# Patient Record
Sex: Female | Born: 2000 | Race: White | Hispanic: No | Marital: Single | Smoking: Current some day smoker
Health system: Southern US, Community
[De-identification: ages and names within clinical notes are randomized; demographics above are authoritative.]

## PROBLEM LIST (undated history)

## (undated) DIAGNOSIS — F4310 Post-traumatic stress disorder, unspecified: Secondary | ICD-10-CM

---

## 2021-10-03 ENCOUNTER — Other Ambulatory Visit: Payer: Self-pay

## 2021-10-03 ENCOUNTER — Emergency Department (HOSPITAL_COMMUNITY): Payer: No Typology Code available for payment source

## 2021-10-03 ENCOUNTER — Emergency Department (HOSPITAL_COMMUNITY)
Admission: EM | Admit: 2021-10-03 | Discharge: 2021-10-03 | Disposition: A | Discharge status: Home / Self Care | Payer: No Typology Code available for payment source | Attending: Emergency Medicine | Admitting: Emergency Medicine

## 2021-10-03 ENCOUNTER — Encounter (HOSPITAL_COMMUNITY): Payer: Self-pay

## 2021-10-03 DIAGNOSIS — R55 Syncope and collapse: Secondary | ICD-10-CM | POA: Diagnosis not present

## 2021-10-03 DIAGNOSIS — S0993XA Unspecified injury of face, initial encounter: Secondary | ICD-10-CM | POA: Diagnosis present

## 2021-10-03 DIAGNOSIS — N9489 Other specified conditions associated with female genital organs and menstrual cycle: Secondary | ICD-10-CM | POA: Insufficient documentation

## 2021-10-03 DIAGNOSIS — W19XXXA Unspecified fall, initial encounter: Secondary | ICD-10-CM | POA: Insufficient documentation

## 2021-10-03 DIAGNOSIS — S0081XA Abrasion of other part of head, initial encounter: Secondary | ICD-10-CM | POA: Diagnosis not present

## 2021-10-03 LAB — I-STAT BETA HCG BLOOD, ED (MC, WL, AP ONLY): I-stat hCG, quantitative: <5 m[IU]/mL (ref <5)

## 2021-10-03 LAB — CBG MONITORING, ED: Glucose-Capillary: 73 mg/dL (ref 70–99)

## 2021-10-03 LAB — CBC
HCT: 40 % (ref 36.0–46.0)
Hemoglobin: 12.8 g/dL (ref 12.0–15.0)
MCH: 27.4 pg (ref 26.0–34.0)
MCHC: 32 g/dL (ref 30.0–36.0)
MCV: 85.7 fL (ref 80.0–100.0)
Platelets: 338 10*3/uL (ref 150–400)
RBC: 4.67 MIL/uL (ref 3.87–5.11)
RDW: 13 % (ref 11.5–15.5)
WBC: 9 10*3/uL (ref 4.0–10.5)
nRBC: 0 % (ref 0.0–0.2)

## 2021-10-03 LAB — BASIC METABOLIC PANEL
Anion gap: 8 (ref 5–15)
BUN: 13 mg/dL (ref 6–20)
CO2: 25 mmol/L (ref 22–32)
Calcium: 9.6 mg/dL (ref 8.9–10.3)
Chloride: 107 mmol/L (ref 98–111)
Creatinine, Ser: 0.85 mg/dL (ref 0.44–1.00)
GFR, Estimated: >60 mL/min (ref >60)
Glucose, Bld: 90 mg/dL (ref 70–99)
Potassium: 3.9 mmol/L (ref 3.5–5.1)
Sodium: 140 mmol/L (ref 135–145)

## 2021-10-03 LAB — URINALYSIS, ROUTINE W REFLEX MICROSCOPIC
Bilirubin Urine: NEGATIVE
Glucose, UA: NEGATIVE mg/dL
Hgb urine dipstick: NEGATIVE
Ketones, ur: 5 mg/dL — AB
Nitrite: NEGATIVE
Protein, ur: NEGATIVE mg/dL
Specific Gravity, Urine: 1.019 (ref 1.005–1.030)
pH: 8 (ref 5.0–8.0)

## 2021-10-03 MED ORDER — ONDANSETRON 4 MG PO TBDP
4.0000 mg | ORAL_TABLET | Freq: Once | ORAL | Status: AC
  Administered 2021-10-03: 4 mg via ORAL
  Filled 2021-10-03: qty 1

## 2021-10-03 MED ORDER — FLUORESCEIN SODIUM 1 MG OP STRP
1.0000 | ORAL_STRIP | Freq: Once | OPHTHALMIC | Status: AC
  Administered 2021-10-03: 1 via OPHTHALMIC
  Filled 2021-10-03: qty 1

## 2021-10-03 MED ORDER — TETRACAINE HCL 0.5 % OP SOLN
1.0000 [drp] | Freq: Once | OPHTHALMIC | Status: AC
  Administered 2021-10-03: 1 [drp] via OPHTHALMIC
  Filled 2021-10-03: qty 4

## 2021-10-03 NOTE — ED Provider Notes (Signed)
?[facility]-EMERGENCY DEPT ?Provider Note ? ? ?CSN: [id number] ?Arrival date & time: 10/03/21  1333 ? ?  ? ?History ? ?Chief Complaint  ?Patient presents with  ? Fall  ? Near Syncope  ? ? ?Victoria Weaver is a 21 y.o. female.  Presents to ER due to concern for fall, near syncope.  Patient states that while she was at work she felt off balance and lightheaded and fell forward and hit the right side of her cheek, eye region.  She does not have any pain in her eye and does not have any blurriness in the eye but has noted a slight redness to the eye.  She does not have any chest pain or difficulty in breathing.  No neck pain or neck stiffness.  She denies any major medical problems.  No family history of sudden cardiac death. ? ?HPI ? ?  ? ?Home Medications ?Prior to Admission medications   ?Not on File  ?   ? ?Allergies    ?Patient has no allergy information on record.   ? ?Review of Systems   ?Review of Systems  ?Constitutional:  Negative for chills and fever.  ?HENT:  Negative for ear pain and sore throat.   ?Eyes:  Negative for pain and visual disturbance.  ?Respiratory:  Negative for cough and shortness of breath.   ?Cardiovascular:  Negative for chest pain and palpitations.  ?Gastrointestinal:  Negative for abdominal pain and vomiting.  ?Genitourinary:  Negative for dysuria and hematuria.  ?Musculoskeletal:  Negative for arthralgias and back pain.  ?Skin:  Negative for color change and rash.  ?Neurological:  Positive for light-headedness. Negative for seizures and syncope.  ?All other systems reviewed and are negative. ? ?Physical Exam ?Updated Vital Signs ?BP 127/85   Pulse 72   Temp (!) 97.3 ?F (36.3 ?C) (Oral)   Resp 19   LMP 09/19/2021 (Approximate)   SpO2 99%  ?Physical Exam ?Vitals and nursing note reviewed.  ?Constitutional:   ?   General: She is not in acute distress. ?   Appearance: She is well-developed.  ?HENT:  ?   Head: Normocephalic.  ?   Comments: Superficial abrasion below  right eye ?Eyes:  ?   Comments: There is slight redness in the conjunctiva of the right eye, there is no foreign body, there is no corneal abrasion noted on fluorescein stain, normal EOM, pupil is equal round and reactive to light  ?Cardiovascular:  ?   Rate and Rhythm: Normal rate and regular rhythm.  ?   Heart sounds: No murmur heard. ?Pulmonary:  ?   Effort: Pulmonary effort is normal. No respiratory distress.  ?   Breath sounds: Normal breath sounds.  ?Abdominal:  ?   Palpations: Abdomen is soft.  ?   Tenderness: There is no abdominal tenderness.  ?Musculoskeletal:     ?   General: No swelling.  ?   Cervical back: Neck supple. No rigidity or tenderness.  ?   Comments: Back: no C, T, L spine TTP, no step off or deformity ?RUE: no TTP throughout, no deformity, normal joint ROM, radial pulse intact, distal sensation and motor intact ?LUE: no TTP throughout, no deformity, normal joint ROM, radial pulse intact, distal sensation and motor intact ?RLE:  no TTP throughout, no deformity, normal joint ROM, distal pulse, sensation and motor intact ?LLE: no TTP throughout, no deformity, normal joint ROM, distal pulse, sensation and motor intact  ?Skin: ?   General: Skin is warm and dry.  ?  Capillary Refill: Capillary refill takes less than 2 seconds.  ?Neurological:  ?   Mental Status: She is alert.  ?Psychiatric:     ?   Mood and Affect: Mood normal.  ? ? ?ED Results / Procedures / Treatments   ?Labs ?(all labs ordered are listed, but only abnormal results are displayed) ?Labs Reviewed  ?URINALYSIS, ROUTINE W REFLEX MICROSCOPIC - Abnormal; Notable for the following components:  ?    Result Value  ? APPearance HAZY (*)   ? Ketones, ur 5 (*)   ? Leukocytes,Ua SMALL (*)   ? Bacteria, UA RARE (*)   ? All other components within normal limits  ?BASIC METABOLIC PANEL  ?CBC  ?CBG MONITORING, ED  ?I-STAT BETA HCG BLOOD, ED ([facility], [facility], [facility] ONLY)  ? ? ?EKG ?EKG Interpretation ? ?Date/Time:  Tuesday October 03 2021 13:44:40  EDT ?Ventricular Rate:  76 ?PR Interval:  147 ?QRS Duration: 82 ?QT Interval:  389 ?QTC Calculation: 438 ?R Axis:   76 ?Text Interpretation: Sinus rhythm ST elev, probable normal early repol pattern Baseline wander in lead(s) I Confirmed by Marianna Fussykstra, Chloey Ricard ([id number]) on 10/03/2021 3:26:33 PM ? ?Radiology ?CT HEAD WO CONTRAST (5MM) ? ?Result Date: 10/03/2021 ?CLINICAL DATA:  Fall, head trauma EXAM: CT HEAD WITHOUT CONTRAST TECHNIQUE: Contiguous axial images were obtained from the base of the skull through the vertex without intravenous contrast. RADIATION DOSE REDUCTION: This exam was performed according to the departmental dose-optimization program which includes automated exposure control, adjustment of the mA and/or kV according to patient size and/or use of iterative reconstruction technique. COMPARISON:  None. FINDINGS: Brain: No acute intracranial hemorrhage, mass effect, or herniation. No extra-axial fluid collections. No evidence of acute territorial infarct. No hydrocephalus. Vascular: No hyperdense vessel or unexpected calcification. Skull: Normal. Negative for fracture or focal lesion. Sinuses/Orbits: No acute finding. Other: None. IMPRESSION: No acute intracranial process identified. Electronically Signed   By: Jannifer Hickelaney  Williams M.D.   On: 10/03/2021 14:35  ? ?CT Maxillofacial Wo Contrast ? ?Result Date: 10/03/2021 ?CLINICAL DATA:  Facial trauma EXAM: CT MAXILLOFACIAL WITHOUT CONTRAST TECHNIQUE: Multidetector CT imaging of the maxillofacial structures was performed. Multiplanar CT image reconstructions were also generated. RADIATION DOSE REDUCTION: This exam was performed according to the departmental dose-optimization program which includes automated exposure control, adjustment of the mA and/or kV according to patient size and/or use of iterative reconstruction technique. COMPARISON:  None. FINDINGS: Osseous: No fracture or mandibular dislocation. No destructive process. Orbits: Negative. No traumatic or  inflammatory finding. Sinuses: No air-fluid levels. Soft tissues: No significant soft tissue swelling appreciated. Limited intracranial: No acute process identified. IMPRESSION: No acute fracture identified. Electronically Signed   By: Jannifer Hickelaney  Williams M.D.   On: 10/03/2021 14:56   ? ?Procedures ?Procedures  ? ? ?Medications Ordered in ED ?Medications  ?ondansetron (ZOFRAN-ODT) disintegrating tablet 4 mg (4 mg Oral Given 10/03/21 1359)  ?fluorescein ophthalmic strip 1 strip (1 strip Both Eyes Given 10/03/21 1606)  ?tetracaine (PONTOCAINE) 0.5 % ophthalmic solution 1 drop (1 drop Both Eyes Given 10/03/21 1606)  ? ? ?ED Course/ Medical Decision Making/ A&P ?  ?                        ?Medical Decision Making ?Amount and/or Complexity of Data Reviewed ?Labs: ordered. ? ?Risk ?Prescription drug management. ? ? ?21 year old presenting to ER after fall at work.  Possible lightheadedness/near syncope associated with this.  CT head was negative, CT max face was negative.  I independently reviewed CT images.  Due to possible near syncope, basic lab work was checked.  No electrolyte derangement and no anemia.  Her eye appeared normal except for a very slight amount of redness in her conjunctiva.  Check fluorescein stain which was unremarkable.  She has no ongoing symptoms.  Discharged home.  Advised follow-up with PCP. ? ? ? ?After the discussed management above, the patient was determined to be safe for discharge.  The patient was in agreement with this plan and all questions regarding their care were answered.  ED return precautions were discussed and the patient will return to the ED with any significant worsening of condition. ? ? ? ? ? ? ? ? ?Final Clinical Impression(s) / ED Diagnoses ?Final diagnoses:  ?Near syncope  ? ? ?Rx / DC Orders ?ED Discharge Orders   ? ? None  ? ?  ? ? ?  ?Milagros Loll, MD ?10/04/21 [phone] ? ?

## 2021-10-03 NOTE — ED Triage Notes (Addendum)
Pt reports she was [place] [employer] and unsure if she lost her balance but felt her depth perception was off and fell forward hitting her right eye on a corner of a counter. Pt reports falling to the floor.  ? ?C/o bilateral headache 9/10 pain ?C/o nausea with some sob  ? ?Report multiple allergies but can't remember what they are. None listed in chart.  ? ?A/Ox4  ? ?

## 2021-10-03 NOTE — Discharge Instructions (Signed)
Follow-up with your primary care doctor.  Come back to ER if you develop further episodes of lightheadedness, any episodes of passing out or other new concerning symptom. ?

## 2021-10-03 NOTE — ED Provider Triage Note (Signed)
Emergency Medicine Provider Triage Evaluation Note ? ?Victoria Weaver , a 21 y.o. female  was evaluated in triage.  Pt complains of headache, facial pain, and nausea after suffering a fall.  Reports that today while at work she went to throw something out and felt that her balance was off.  Patient reports that she fell forward striking the right side of her cheek against the corner of a counter.  Patient endorses falling to the ground.  Denies any loss of consciousness.  Patient has been up and ambulatory since the fall.  Reports that she has had a headache and persistent nausea since the accident.  Denies any neck pain or back pain.  No preceding chest pain, shortness of breath, or sudden onset of headache prior to her fall. ? ?Review of Systems  ?Positive: Headache, facial pain, nausea ?Negative: Neck pain, back pain, numbness, weakness, chest pain, shortness of breath, abdominal pain ? ?Physical Exam  ?BP 129/85 (BP Location: Right Arm)   Pulse 67   Temp (!) 97.3 ?F (36.3 ?C) (Oral)   Resp 19   LMP 09/19/2021 (Approximate)   SpO2 100%  ?Gen:   Awake, no distress   ?Resp:  Normal effort  ?MSK:   Moves extremities without difficulty  ?Other:  Superficial abrasion below right eye.  Tenderness to right zygomatic arch.  No midline tenderness or deformity to cervical, thoracic, lumbar spine. ? ?Medical Decision Making  ?Medically screening exam initiated at 1:55 PM.  Appropriate orders placed.  Tasheba Henson was informed that the remainder of the evaluation will be completed by another provider, this initial triage assessment does not replace that evaluation, and the importance of remaining in the ED until their evaluation is complete. ? ?Due to persistent nausea after head trauma will obtain noncontrast head CT.  Today tenderness to right zygomatic arch after blunt facial trauma will obtain CT maxillofacial without contrast.  Patient given Zofran for nausea. ?  ?Haskel Schroeder, PA-C ?10/03/21 1357 ? ?

## 2021-10-03 NOTE — ED Notes (Signed)
MD at bedside. 

## 2022-01-05 ENCOUNTER — Ambulatory Visit: Payer: Self-pay | Admitting: Family Medicine

## 2022-01-27 NOTE — Progress Notes (Signed)
Erroneous encounter-disregard

## 2022-02-05 ENCOUNTER — Encounter: Payer: Medicaid Other | Admitting: Family

## 2022-02-05 DIAGNOSIS — Z7689 Persons encountering health services in other specified circumstances: Secondary | ICD-10-CM

## 2022-02-06 ENCOUNTER — Encounter: Payer: Self-pay | Admitting: Family

## 2022-03-20 NOTE — Progress Notes (Signed)
  Subjective:    Victoria Weaver - 21 y.o. female MRN [id number]  Date of birth: 29-Apr-2001  HPI  Victoria Weaver is a 21 year-old female to establish care.  Current issues and/or concerns: None. Plans to return at later date for physical and fasting labs.    ROS per HPI    Health Maintenance:  Health Maintenance Due  Topic Date Due   HIV Screening  Never done   Hepatitis C Screening  Never done    Past Medical History: There are no problems to display for this patient.   Social History   reports that she has never smoked. She has never been exposed to tobacco smoke. She has never used smokeless tobacco. She reports that she does not currently use alcohol. She reports that she does not currently use drugs after having used the following drugs: Cocaine, Marijuana, MDMA (Ecstacy), and "Crack" cocaine.   Family History  family history is not on file.   Medications: reviewed and updated   Objective:   Physical Exam BP 113/77 (BP Location: Left Arm, Patient Position: Sitting, Cuff Size: Large)   Pulse 69   Temp 98.3 F (36.8 C)   Resp 16   Ht 5' 9.69" (1.77 m)   Wt 188 lb (85.3 kg)   SpO2 99%   BMI 27.22 kg/m   Physical Exam HENT:     Head: Normocephalic and atraumatic.  Eyes:     Extraocular Movements: Extraocular movements intact.     Conjunctiva/sclera: Conjunctivae normal.     Pupils: Pupils are equal, round, and reactive to light.  Cardiovascular:     Rate and Rhythm: Normal rate and regular rhythm.     Pulses: Normal pulses.     Heart sounds: Normal heart sounds.  Pulmonary:     Effort: Pulmonary effort is normal.     Breath sounds: Normal breath sounds.  Musculoskeletal:     Cervical back: Normal range of motion and neck supple.  Neurological:     General: No focal deficit present.     Mental Status: She is alert and oriented to person, place, and time.  Psychiatric:        Mood and Affect: Mood normal.        Behavior: Behavior normal.     Assessment & Plan:  1. Encounter to establish care - Patient presents today to establish care.  - Return for annual physical examination, labs, and health maintenance. Arrive fasting meaning having no food for at least 8 hours prior to appointment. You may have only water or black coffee. Please take scheduled medications as normal.  2. Need for Tdap vaccination - Administered.  - Tdap vaccine greater than or equal to 7yo IM    Patient was given clear instructions to go to Emergency Department or return to medical center if symptoms don't improve, worsen, or new problems develop.The patient verbalized understanding.  I discussed the assessment and treatment plan with the patient. The patient was provided an opportunity to ask questions and all were answered. The patient agreed with the plan and demonstrated an understanding of the instructions.   The patient was advised to call back or seek an in-person evaluation if the symptoms worsen or if the condition fails to improve as anticipated.    Durene Fruits, NP 03/30/2022, 12:31 PM [facility]

## 2022-03-30 ENCOUNTER — Encounter: Payer: Self-pay | Admitting: Family

## 2022-03-30 ENCOUNTER — Ambulatory Visit (INDEPENDENT_AMBULATORY_CARE_PROVIDER_SITE_OTHER): Payer: Medicaid Other | Admitting: Family

## 2022-03-30 VITALS — BP 113/77 | HR 69 | Temp 98.3°F | Resp 16 | Ht 69.69 in | Wt 188.0 lb

## 2022-03-30 DIAGNOSIS — Z23 Encounter for immunization: Secondary | ICD-10-CM

## 2022-03-30 DIAGNOSIS — Z7689 Persons encountering health services in other specified circumstances: Secondary | ICD-10-CM

## 2022-03-30 NOTE — Patient Instructions (Signed)
Thank you for choosing Primary Care at [facility] for your medical home!    Victoria Weaver was seen by Camillia Herter, NP today.   Victoria Weaver primary care provider is Camillia Herter, NP.   For the best care possible,  you should try to see Durene Fruits, NP whenever you come to office.   We look forward to seeing you again soon!  If you have any questions about your visit today,  please call us at [phone]  Or feel free to reach your provider via [place].    Keeping you healthy   Get these tests Blood pressure- Have your blood pressure checked once a year by your healthcare provider.  Normal blood pressure is 120/80. Weight- Have your body mass index (BMI) calculated to screen for obesity.  BMI is a measure of body fat based on height and weight. You can also calculate your own BMI at [employer].it. Cholesterol- Have your cholesterol checked regularly starting at age 67, sooner may be necessary if you have diabetes, high blood pressure, if a family member developed heart diseases at an early age or if you smoke.  Chlamydia, HIV, and other sexual transmitted disease- Get screened each year until the age of 75 then within three months of each new sexual partner. Diabetes- Have your blood sugar checked regularly if you have high blood pressure, high cholesterol, a family history of diabetes or if you are overweight.   Get these vaccines Flu shot- Every fall. Tetanus shot- Every 10 years. Menactra- Single dose; prevents meningitis.   Take these steps Don't smoke- If you do smoke, ask your healthcare provider about quitting. For tips on how to quit, go to www.smokefree.gov or call 1-800-QUIT-NOW. Be physically active- Exercise 5 days a week for at least 30 minutes.  If you are not already physically active start slow and gradually work up to 30 minutes of moderate physical activity.  Examples of moderate activity include walking briskly, mowing the yard, dancing,  swimming bicycling, etc. Eat a healthy diet- Eat a variety of healthy foods such as fruits, vegetables, low fat milk, low fat cheese, yogurt, lean meats, poultry, fish, beans, tofu, etc.  For more information on healthy eating, go to www.thenutritionsource.org Drink alcohol in moderation- Limit alcohol intake two drinks or less a day.  Never drink and drive. Dentist- Brush and floss teeth twice daily; visit your dentis twice a year. Depression-Your emotional health is as important as your physical health.  If you're feeling down, losing interest in things you normally enjoy please talk with your healthcare provider. Gun Safety- If you keep a gun in your home, keep it unloaded and with the safety lock on.  Bullets should be stored separately. Helmet use- Always wear a helmet when riding a motorcycle, bicycle, rollerblading or skateboarding. Safe sex- If you may be exposed to a sexually transmitted infection, use a condom Seat belts- Seat bels can save your life; always wear one. Smoke/Carbon Monoxide detectors- These detectors need to be installed on the appropriate level of your home.  Replace batteries at least once a year. Skin Cancer- When out in the sun, cover up and use sunscreen SPF 15 or higher. Violence- If anyone is threatening or hurting you, please tell your healthcare provider.

## 2022-03-30 NOTE — Progress Notes (Signed)
.  Pt presents to establish care,   

## 2022-07-13 NOTE — Progress Notes (Deleted)
    Patient ID: Victoria Weaver, female    DOB: 2000/12/21  MRN: [id number]  CC: Annual Physical Exam  Subjective: Victoria Weaver is a 22 y.o. female who presents for annual physical exam.   Her concerns today include:    There are no problems to display for this patient.    No current outpatient medications on file prior to visit.   No current facility-administered medications on file prior to visit.    Not on File  Social History   Socioeconomic History   Marital status: Single    Spouse name: Not on file   Number of children: Not on file   Years of education: Not on file   Highest education level: Not on file  Occupational History   Not on file  Tobacco Use   Smoking status: Never    Passive exposure: Never   Smokeless tobacco: Never  Substance and Sexual Activity   Alcohol use: Not Currently    Comment: no use since 10 months ago   Drug use: Not Currently    Types: Cocaine, Marijuana, MDMA (Ecstacy), "Crack" cocaine    Comment: no use since 10 months ago   Sexual activity: Not Currently    Birth control/protection: None  Other Topics Concern   Not on file  Social History Narrative   Not on file   Social Determinants of Health   Financial Resource Strain: Not on file  Food Insecurity: Not on file  Transportation Needs: Not on file  Physical Activity: Not on file  Stress: Not on file  Social Connections: Not on file  Intimate Partner Violence: Not on file    No family history on file.  No past surgical history on file.  ROS: Review of Systems Negative except as stated above  PHYSICAL EXAM: There were no vitals taken for this visit.  Physical Exam  {female adult master:310786} {female adult master:310785}     Latest Ref Rng & Units 10/03/2021    1:56 PM  CMP  Glucose 70 - 99 mg/dL 90   BUN 6 - 20 mg/dL 13   Creatinine 0.44 - 1.00 mg/dL 0.85   Sodium 135 - 145 mmol/L 140   Potassium 3.5 - 5.1 mmol/L 3.9   Chloride 98 - 111 mmol/L 107   CO2  22 - 32 mmol/L 25   Calcium 8.9 - 10.3 mg/dL 9.6    Lipid Panel  No results found for: "CHOL", "TRIG", "HDL", "CHOLHDL", "VLDL", "LDLCALC", "LDLDIRECT"  CBC    Component Value Date/Time   WBC 9.0 10/03/2021 1356   RBC 4.67 10/03/2021 1356   HGB 12.8 10/03/2021 1356   HCT 40.0 10/03/2021 1356   PLT 338 10/03/2021 1356   MCV 85.7 10/03/2021 1356   MCH 27.4 10/03/2021 1356   MCHC 32.0 10/03/2021 1356   RDW 13.0 10/03/2021 1356    ASSESSMENT AND PLAN:  There are no diagnoses linked to this encounter.   Patient was given the opportunity to ask questions.  Patient verbalized understanding of the plan and was able to repeat key elements of the plan. Patient was given clear instructions to go to Emergency Department or return to medical center if symptoms don't improve, worsen, or new problems develop.The patient verbalized understanding.   No orders of the defined types were placed in this encounter.    Requested Prescriptions    No prescriptions requested or ordered in this encounter    No follow-ups on file.  Camillia Herter, NP

## 2022-07-16 ENCOUNTER — Encounter: Payer: Medicaid Other | Admitting: Family

## 2022-07-16 NOTE — Progress Notes (Unsigned)
Patient ID: Winola Bechard, female    DOB: 25-Sep-2000  MRN: [id number]  CC: Annual Physical Exam  Subjective: Victoria Weaver is a 22 y.o. female who presents for annual physical exam. She is accompanied by her significant other.  Her concerns today include:  Requests pregnancy test due to her period is 1 week late. No further issues/concerns.   There are no problems to display for this patient.    No current outpatient medications on file prior to visit.   No current facility-administered medications on file prior to visit.    No Known Allergies  Social History   Socioeconomic History   Marital status: Single    Spouse name: Not on file   Number of children: Not on file   Years of education: Not on file   Highest education level: Not on file  Occupational History   Not on file  Tobacco Use   Smoking status: Never    Passive exposure: Never   Smokeless tobacco: Never  Substance and Sexual Activity   Alcohol use: Not Currently    Comment: no use since 10 months ago   Drug use: Not Currently    Types: Cocaine, Marijuana, MDMA (Ecstacy), "Crack" cocaine    Comment: no use since 10 months ago   Sexual activity: Not Currently    Birth control/protection: None  Other Topics Concern   Not on file  Social History Narrative   Not on file   Social Determinants of Health   Financial Resource Strain: Not on file  Food Insecurity: Not on file  Transportation Needs: Not on file  Physical Activity: Not on file  Stress: Not on file  Social Connections: Not on file  Intimate Partner Violence: Not on file    History reviewed. No pertinent family history.  History reviewed. No pertinent surgical history.  ROS: Review of Systems Negative except as stated above  PHYSICAL EXAM: BP 123/84 (BP Location: Left Arm, Patient Position: Sitting, Cuff Size: Normal)   Pulse 82   Temp 98.3 F (36.8 C)   Resp 18   Ht 5' 9.69" (1.77 m)   Wt 184 lb (83.5 kg)   SpO2 98%   BMI  26.64 kg/m   Physical Exam HENT:     Head: Normocephalic and atraumatic.     Right Ear: Tympanic membrane, ear canal and external ear normal.     Left Ear: Tympanic membrane, ear canal and external ear normal.     Nose: Nose normal.     Mouth/Throat:     Mouth: Mucous membranes are moist.     Pharynx: Oropharynx is clear.  Eyes:     Extraocular Movements: Extraocular movements intact.     Conjunctiva/sclera: Conjunctivae normal.     Pupils: Pupils are equal, round, and reactive to light.  Cardiovascular:     Rate and Rhythm: Normal rate and regular rhythm.     Pulses: Normal pulses.     Heart sounds: Normal heart sounds.  Pulmonary:     Effort: Pulmonary effort is normal.     Breath sounds: Normal breath sounds.  Chest:     Comments: Patient declined.  Abdominal:     General: Bowel sounds are normal.     Palpations: Abdomen is soft.  Genitourinary:    Comments: Patient declined.  Musculoskeletal:        General: Normal range of motion.     Right shoulder: Normal.     Left shoulder: Normal.     Right  upper arm: Normal.     Left upper arm: Normal.     Right elbow: Normal.     Left elbow: Normal.     Right forearm: Normal.     Left forearm: Normal.     Right wrist: Normal.     Left wrist: Normal.     Right hand: Normal.     Left hand: Normal.     Cervical back: Normal, normal range of motion and neck supple.     Thoracic back: Normal.     Lumbar back: Normal.     Right hip: Normal.     Left hip: Normal.     Right upper leg: Normal.     Left upper leg: Normal.     Right knee: Normal.     Left knee: Normal.     Right lower leg: Normal.     Left lower leg: Normal.     Right ankle: Normal.     Left ankle: Normal.     Right foot: Normal.     Left foot: Normal.  Skin:    General: Skin is warm and dry.     Capillary Refill: Capillary refill takes less than 2 seconds.  Neurological:     General: No focal deficit present.     Mental Status: She is alert and  oriented to person, place, and time.  Psychiatric:        Mood and Affect: Mood normal.        Behavior: Behavior normal.    Results for orders placed or performed in visit on 07/17/22  POCT urine pregnancy  Result Value Ref Range   Preg Test, Ur Negative Negative    ASSESSMENT AND PLAN: 1. Annual physical exam - Counseled on 150 minutes of exercise per week as tolerated, healthy eating (including decreased daily intake of saturated fats, cholesterol, added sugars, sodium), STI prevention, and routine healthcare maintenance.  2. Screening for metabolic disorder - Routine screening.  - CMP14+EGFR  3. Screening for deficiency anemia - Routine screening.  - CBC  4. Diabetes mellitus screening - Routine screening.  - Hemoglobin A1c  5. Screening cholesterol level - Routine screening.  - Lipid panel  6. Thyroid disorder screen - Routine screening.  - TSH  7. Encounter for screening for HIV - Routine screening.  - HIV antibody (with reflex)  8. Need for hepatitis C screening test - Routine screening.  - Hepatitis C Antibody  9. Possible pregnancy - Urine pregnancy negative.  - POCT urine pregnancy   Patient was given the opportunity to ask questions.  Patient verbalized understanding of the plan and was able to repeat key elements of the plan. Patient was given clear instructions to go to Emergency Department or return to medical center if symptoms don't improve, worsen, or new problems develop.The patient verbalized understanding.   Orders Placed This Encounter  Procedures   HIV antibody (with reflex)   Hepatitis C Antibody   CBC   Lipid panel   TSH   CMP14+EGFR   Hemoglobin A1c   POCT urine pregnancy    Return in about 1 year (around 07/18/2023) for Physical per patient preference.  Rema Fendt, NP

## 2022-07-17 ENCOUNTER — Ambulatory Visit (INDEPENDENT_AMBULATORY_CARE_PROVIDER_SITE_OTHER): Payer: Medicaid Other | Admitting: Family

## 2022-07-17 ENCOUNTER — Encounter: Payer: Self-pay | Admitting: Family

## 2022-07-17 VITALS — BP 123/84 | HR 82 | Temp 98.3°F | Resp 18 | Ht 69.69 in | Wt 184.0 lb

## 2022-07-17 DIAGNOSIS — Z130 Encounter for screening for diseases of the blood and blood-forming organs and certain disorders involving the immune mechanism: Secondary | ICD-10-CM

## 2022-07-17 DIAGNOSIS — Z13228 Encounter for screening for other metabolic disorders: Secondary | ICD-10-CM

## 2022-07-17 DIAGNOSIS — Z3200 Encounter for pregnancy test, result unknown: Secondary | ICD-10-CM

## 2022-07-17 DIAGNOSIS — Z131 Encounter for screening for diabetes mellitus: Secondary | ICD-10-CM

## 2022-07-17 DIAGNOSIS — Z3202 Encounter for pregnancy test, result negative: Secondary | ICD-10-CM | POA: Diagnosis not present

## 2022-07-17 DIAGNOSIS — Z0000 Encounter for general adult medical examination without abnormal findings: Secondary | ICD-10-CM | POA: Diagnosis not present

## 2022-07-17 DIAGNOSIS — Z1159 Encounter for screening for other viral diseases: Secondary | ICD-10-CM

## 2022-07-17 DIAGNOSIS — Z114 Encounter for screening for human immunodeficiency virus [HIV]: Secondary | ICD-10-CM

## 2022-07-17 DIAGNOSIS — Z1329 Encounter for screening for other suspected endocrine disorder: Secondary | ICD-10-CM

## 2022-07-17 DIAGNOSIS — Z13220 Encounter for screening for lipoid disorders: Secondary | ICD-10-CM

## 2022-07-17 LAB — POCT URINE PREGNANCY: Preg Test, Ur: NEGATIVE

## 2022-07-17 NOTE — Addendum Note (Signed)
Addended by: Elmon Else on: 07/17/2022 04:54 PM   Modules accepted: Orders

## 2022-07-17 NOTE — Progress Notes (Signed)
.  Pt presents for annual physical exam   -pt request pregnancy test missed cycle this week

## 2022-07-17 NOTE — Patient Instructions (Signed)

## 2022-07-18 ENCOUNTER — Other Ambulatory Visit: Payer: Medicaid Other

## 2022-07-18 LAB — CMP14+EGFR
ALT: 9 IU/L (ref 0–32)
AST: 13 IU/L (ref 0–40)
Albumin/Globulin Ratio: 1.8 (ref 1.2–2.2)
Albumin: 4.7 g/dL (ref 4.0–5.0)
Alkaline Phosphatase: 82 IU/L (ref 42–106)
BUN/Creatinine Ratio: 10 (ref 9–23)
BUN: 10 mg/dL (ref 6–20)
Bilirubin Total: 0.2 mg/dL (ref 0.0–1.2)
CO2: 21 mmol/L (ref 20–29)
Calcium: 9.6 mg/dL (ref 8.7–10.2)
Chloride: 106 mmol/L (ref 96–106)
Creatinine, Ser: 0.96 mg/dL (ref 0.57–1.00)
Globulin, Total: 2.6 g/dL (ref 1.5–4.5)
Glucose: 69 mg/dL — ABNORMAL LOW (ref 70–99)
Potassium: 4.5 mmol/L (ref 3.5–5.2)
Sodium: 142 mmol/L (ref 134–144)
Total Protein: 7.3 g/dL (ref 6.0–8.5)
eGFR: 87 mL/min/{1.73_m2} (ref >59)

## 2022-07-18 LAB — HIV ANTIBODY (ROUTINE TESTING W REFLEX): HIV Screen 4th Generation wRfx: NONREACTIVE

## 2022-07-18 LAB — CBC
Hematocrit: 37.3 % (ref 34.0–46.6)
Hemoglobin: 12.7 g/dL (ref 11.1–15.9)
MCH: 29.8 pg (ref 26.6–33.0)
MCHC: 34 g/dL (ref 31.5–35.7)
MCV: 88 fL (ref 79–97)
Platelets: 334 10*3/uL (ref 150–450)
RBC: 4.26 x10E6/uL (ref 3.77–5.28)
RDW: 13.1 % (ref 11.7–15.4)
WBC: 6.2 10*3/uL (ref 3.4–10.8)

## 2022-07-18 LAB — HCG, SERUM, QUALITATIVE: hCG,Beta Subunit,Qual,Serum: NEGATIVE m[IU]/mL (ref <6)

## 2022-07-18 LAB — TSH: TSH: 2.07 u[IU]/mL (ref 0.450–4.500)

## 2022-07-18 LAB — LIPID PANEL
Chol/HDL Ratio: 3.5 ratio (ref 0.0–4.4)
Cholesterol, Total: 160 mg/dL (ref 100–199)
HDL: 46 mg/dL (ref >39)
LDL Chol Calc (NIH): 101 mg/dL — ABNORMAL HIGH (ref 0–99)
Triglycerides: 67 mg/dL (ref 0–149)
VLDL Cholesterol Cal: 13 mg/dL (ref 5–40)

## 2022-07-18 LAB — HEMOGLOBIN A1C
Est. average glucose Bld gHb Est-mCnc: 105 mg/dL
Hgb A1c MFr Bld: 5.3 % (ref 4.8–5.6)

## 2022-07-18 LAB — HEPATITIS C ANTIBODY: Hep C Virus Ab: NONREACTIVE

## 2022-07-30 ENCOUNTER — Ambulatory Visit (HOSPITAL_COMMUNITY)
Admission: EM | Admit: 2022-07-30 | Discharge: 2022-07-30 | Disposition: A | Discharge status: Home / Self Care | Payer: Medicaid Other | Attending: Psychiatry | Admitting: Psychiatry

## 2022-07-30 DIAGNOSIS — F419 Anxiety disorder, unspecified: Secondary | ICD-10-CM | POA: Insufficient documentation

## 2022-07-30 NOTE — ED Triage Notes (Signed)
Pt presents to [facility] requesting a mental health evaluation. Pt states she has increased anxiety and she saw her therapist today and was referred to this facility for medication management by her therapist. Pt denies SI/HI and AVH.Pt was set up with an appointment in outpatient for February 23rd at [facility]. Pt signed MSE waiver.

## 2022-08-03 ENCOUNTER — Encounter (HOSPITAL_COMMUNITY): Payer: Self-pay | Admitting: Psychiatry

## 2022-08-03 ENCOUNTER — Ambulatory Visit (INDEPENDENT_AMBULATORY_CARE_PROVIDER_SITE_OTHER): Payer: Medicaid Other | Admitting: Psychiatry

## 2022-08-03 DIAGNOSIS — F411 Generalized anxiety disorder: Secondary | ICD-10-CM | POA: Diagnosis not present

## 2022-08-03 DIAGNOSIS — F4310 Post-traumatic stress disorder, unspecified: Secondary | ICD-10-CM

## 2022-08-03 MED ORDER — FLUOXETINE HCL 10 MG PO CAPS: 10.0000 mg | ORAL_CAPSULE | Freq: Every day | ORAL | 3 refills | Status: DC
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
## 2022-08-20 ENCOUNTER — Encounter: Payer: Self-pay | Admitting: Family

## 2022-10-16 ENCOUNTER — Encounter (HOSPITAL_COMMUNITY): Payer: Self-pay | Admitting: Psychiatry

## 2022-10-16 ENCOUNTER — Ambulatory Visit (INDEPENDENT_AMBULATORY_CARE_PROVIDER_SITE_OTHER): Payer: Medicaid Other | Admitting: Psychiatry

## 2022-10-16 DIAGNOSIS — F4310 Post-traumatic stress disorder, unspecified: Secondary | ICD-10-CM | POA: Diagnosis not present

## 2022-10-16 DIAGNOSIS — F411 Generalized anxiety disorder: Secondary | ICD-10-CM | POA: Diagnosis not present

## 2022-10-16 MED ORDER — FLUOXETINE HCL 10 MG PO CAPS: 10.0000 mg | ORAL_CAPSULE | Freq: Every day | ORAL | 3 refills | Status: DC

## 2022-10-16 NOTE — Progress Notes (Signed)
BH MD/PA/NP OP Progress Note  10/16/2022 1:27 PM Victoria Weaver  MRN:  [id number]  Chief Complaint: "Im no longer having panic attacks"  HPI: 22 year old female seen today for initial psychiatric evaluation.  She has a psychiatric history of PTSD, anxiety, substance use (Ritalin, methamphetamines, cocaine, Adderall, Xanax), and depression.  Currently she is managed on Prozac 10 mg daily.  She notes her medication is effective in managing her psychiatric conditions.    Today she is well-groomed, pleasant, cooperative, and engaged in conversation.  She informed Clinical research associate that she she is no longer having panic attacks.  She also notes that her mood is stable and reports that she has minimal anxiety and depression.  Today provider conducted a GAD-7 and patient scored a 4, at her last visit she scored a 5.  Provider also conducted PHQ-9 the patient scored a 4, at her last visit she scored a 2.  She endorses adequate sleep and appetite.  Today she denies SI/HI/VAH, mania, or paranoia.   Patient informed Clinical research associate that she has been sober from illegal substances for 16 months.  Patient currently lives at [place] house and she notes that she has been attending NA meetings regularly.  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  She informed Clinical research associate that she find enjoyment in her 2 jobs.   No medication changes made today.  Patient agreeable to continue medications as prescribed.  No other concerns noted at this time.   Visit Diagnosis:    ICD-10-CM   1. Anxiety state  F41.1 FLUoxetine (PROZAC) 10 MG capsule    2. PTSD (post-traumatic stress disorder)  F43.10 FLUoxetine (PROZAC) 10 MG capsule      Past Psychiatric History: history of PTSD, anxiety, substance use (Ritalin, methamphetamines, cocaine, Adderall, Xanax), and depression  Past Medical History: No past medical history on file. No past surgical history on file.  Family Psychiatric History: Sister bipolar, Mother schizophrenia and bipolar  disorder, father depression.  Also notes several members of family have substance use issues   Family History: No family history on file.  Social History:  Social History   Socioeconomic History   Marital status: Single    Spouse name: Not on file   Number of children: Not on file   Years of education: Not on file   Highest education level: Not on file  Occupational History   Not on file  Tobacco Use   Smoking status: Never    Passive exposure: Never   Smokeless tobacco: Never  Substance and Sexual Activity   Alcohol use: Not Currently    Comment: no use since 10 months ago   Drug use: Not Currently    Types: Cocaine, Marijuana, MDMA (Ecstacy), "Crack" cocaine    Comment: no use since 10 months ago   Sexual activity: Not Currently    Birth control/protection: None  Other Topics Concern   Not on file  Social History Narrative   Not on file   Social Determinants of Health   Financial Resource Strain: Not on file  Food Insecurity: Not on file  Transportation Needs: Not on file  Physical Activity: Not on file  Stress: Not on file  Social Connections: Not on file    Allergies: No Known Allergies  Metabolic Disorder Labs: Lab Results  Component Value Date   HGBA1C 5.3 07/17/2022   No results found for: "PROLACTIN" Lab Results  Component Value Date   CHOL 160 07/17/2022   TRIG 67 07/17/2022   HDL 46 07/17/2022  CHOLHDL 3.5 07/17/2022   LDLCALC 101 (H) 07/17/2022   Lab Results  Component Value Date   TSH 2.070 07/17/2022    Therapeutic Level Labs: No results found for: "LITHIUM" No results found for: "VALPROATE" No results found for: "CBMZ"  Current Medications: Current Outpatient Medications  Medication Sig Dispense Refill   FLUoxetine (PROZAC) 10 MG capsule Take 1 capsule (10 mg total) by mouth daily. 30 capsule 3   No current facility-administered medications for this visit.     Musculoskeletal: Strength & Muscle Tone: within normal  limits Gait & Station: normal Patient leans: N/A  Psychiatric Specialty Exam: Review of Systems  There were no vitals taken for this visit.There is no height or weight on file to calculate BMI.  General Appearance: Well Groomed  Eye Contact:  Good  Speech:  Clear and Coherent and Normal Rate  Volume:  Normal  Mood:  Euthymic  Affect:  Appropriate and Congruent  Thought Process:  Coherent, Goal Directed, and Linear  Orientation:  Full (Time, Place, and Person)  Thought Content: WDL and Logical   Suicidal Thoughts:  No  Homicidal Thoughts:  No  Memory:  Immediate;   Good Recent;   Good Remote;   Good  Judgement:  Good  Insight:  Good  Psychomotor Activity:  Normal  Concentration:  Concentration: Good and Attention Span: Good  Recall:  Good  Fund of Knowledge: Good  Language: Good  Akathisia:  No  Handed:  Ambidextrous  AIMS (if indicated): not done  Assets:  Communication Skills Desire for Improvement Financial Resources/Insurance Housing Leisure Time Physical Health Social Support Talents/Skills Vocational/Educational  ADL's:  Intact  Cognition: WNL  Sleep:  Good   Screenings: GAD-7    Flowsheet Row Clinical Support from 10/16/2022 in [facility] Office Visit from 08/03/2022 in [facility]  Total GAD-7 Score 4 5      PHQ2-9    Flowsheet Row Clinical Support from 10/16/2022 in [facility] Office Visit from 08/03/2022 in [facility] Office Visit from 07/17/2022 in [facility] Primary Care at [facility] Office Visit from 03/30/2022 in [facility] Primary Care at [facility]  PHQ-2 Total Score 1 1 0 0  PHQ-9 Total Score 4 2 -- --      Flowsheet Row Office Visit from 08/03/2022 in [facility] ED from 07/30/2022 in [facility] ED from 10/03/2021 in [facility] Emergency Department at [facility]  C-SSRS RISK CATEGORY No Risk No Risk No Risk        Assessment and Plan: Patient reports that she is doing well on her current medication regimen.  No medication changes made today.  Patient agreeable to continue medications as prescribed.  1. Anxiety state  Continue- FLUoxetine (PROZAC) 10 MG capsule; Take 1 capsule (10 mg total) by mouth daily.  Dispense: 30 capsule; Refill: 3  2. PTSD (post-traumatic stress disorder)  Continue- FLUoxetine (PROZAC) 10 MG capsule; Take 1 capsule (10 mg total) by mouth daily.  Dispense: 30 capsule; Refill: 3   Collaboration of Care: Collaboration of Care: Other provider involved in patient's care AEB PCP and counselor  Patient/Guardian was advised Release of Information must be obtained prior to any record release in order to collaborate their care with an outside provider. Patient/Guardian was advised if they have not already done so to contact the registration department to sign all necessary forms in order for [place]  to release information regarding their care.   Consent: Patient/Guardian gives verbal consent for treatment and assignment of benefits for services provided during this visit. Patient/Guardian expressed understanding and agreed to proceed.   Follow-up in 2.5 months Follow-up with NA/therapy Shanna Cisco, NP 10/16/2022, 1:27 PM

## 2022-12-06 NOTE — Progress Notes (Deleted)
Patient ID: Victoria Weaver, female    DOB: 12/14/2000  MRN: [id number]  CC: Follow-Up  Subjective: Victoria Weaver is a 22 y.o. female who presents for follow-up.   Her concerns today include:  Please get more details from patient.    There are no problems to display for this patient.    Current Outpatient Medications on File Prior to Visit  Medication Sig Dispense Refill   FLUoxetine (PROZAC) 10 MG capsule Take 1 capsule (10 mg total) by mouth daily. 30 capsule 3   No current facility-administered medications on file prior to visit.    No Known Allergies  Social History   Socioeconomic History   Marital status: Single    Spouse name: Not on file   Number of children: Not on file   Years of education: Not on file   Highest education level: 12th grade  Occupational History   Not on file  Tobacco Use   Smoking status: Never    Passive exposure: Never   Smokeless tobacco: Never  Substance and Sexual Activity   Alcohol use: Not Currently    Comment: no use since 10 months ago   Drug use: Not Currently    Types: Cocaine, Marijuana, MDMA (Ecstacy), "Crack" cocaine    Comment: no use since 10 months ago   Sexual activity: Not Currently    Birth control/protection: None  Other Topics Concern   Not on file  Social History Narrative   Not on file   Social Determinants of Health   Financial Resource Strain: Low Risk  (12/06/2022)   Overall Financial Resource Strain (CARDIA)    Difficulty of Paying Living Expenses: Not very hard  Food Insecurity: Food Insecurity Present (12/06/2022)   Hunger Vital Sign    Worried About Running Out of Food in the Last Year: Sometimes true    Ran Out of Food in the Last Year: Never true  Transportation Needs: No Transportation Needs (12/06/2022)   PRAPARE - Administrator, Civil Service (Medical): No    Lack of Transportation (Non-Medical): No  Physical Activity: Sufficiently Active (12/06/2022)   Exercise Vital Sign    Days  of Exercise per Week: 4 days    Minutes of Exercise per Session: 150+ min  Stress: Stress Concern Present (12/06/2022)   [employer] of Occupational Health - Occupational Stress Questionnaire    Feeling of Stress : To some extent  Social Connections: Moderately Integrated (12/06/2022)   Social Connection and Isolation Panel [NHANES]    Frequency of Communication with Friends and Family: Three times a week    Frequency of Social Gatherings with Friends and Family: Patient declined    Attends Religious Services: Never    Database administrator or Organizations: Yes    Attends Engineer, structural: More than 4 times per year    Marital Status: Living with partner  Intimate Partner Violence: Not on file    No family history on file.  No past surgical history on file.  ROS: Review of Systems Negative except as stated above  PHYSICAL EXAM: There were no vitals taken for this visit.  Physical Exam  {female adult master:310786} {female adult master:310785}     Latest Ref Rng & Units 07/17/2022    2:24 PM 10/03/2021    1:56 PM  CMP  Glucose 70 - 99 mg/dL 69  90   BUN 6 - 20 mg/dL 10  13   Creatinine 4.09 - 1.00 mg/dL 8.11  9.14  Sodium 134 - 144 mmol/L 142  140   Potassium 3.5 - 5.2 mmol/L 4.5  3.9   Chloride 96 - 106 mmol/L 106  107   CO2 20 - 29 mmol/L 21  25   Calcium 8.7 - 10.2 mg/dL 9.6  9.6   Total Protein 6.0 - 8.5 g/dL 7.3    Total Bilirubin 0.0 - 1.2 mg/dL 0.2    Alkaline Phos 42 - 106 IU/L 82    AST 0 - 40 IU/L 13    ALT 0 - 32 IU/L 9     Lipid Panel     Component Value Date/Time   CHOL 160 07/17/2022 1424   TRIG 67 07/17/2022 1424   HDL 46 07/17/2022 1424   CHOLHDL 3.5 07/17/2022 1424   LDLCALC 101 (H) 07/17/2022 1424    CBC    Component Value Date/Time   WBC 6.2 07/17/2022 1424   WBC 9.0 10/03/2021 1356   RBC 4.26 07/17/2022 1424   RBC 4.67 10/03/2021 1356   HGB 12.7 07/17/2022 1424   HCT 37.3 07/17/2022 1424   PLT 334 07/17/2022 1424    MCV 88 07/17/2022 1424   MCH 29.8 07/17/2022 1424   MCH 27.4 10/03/2021 1356   MCHC 34.0 07/17/2022 1424   MCHC 32.0 10/03/2021 1356   RDW 13.1 07/17/2022 1424    ASSESSMENT AND PLAN:  There are no diagnoses linked to this encounter.   Patient was given the opportunity to ask questions.  Patient verbalized understanding of the plan and was able to repeat key elements of the plan. Patient was given clear instructions to go to Emergency Department or return to medical center if symptoms don't improve, worsen, or new problems develop.The patient verbalized understanding.   No orders of the defined types were placed in this encounter.    Requested Prescriptions    No prescriptions requested or ordered in this encounter    No follow-ups on file.  Rema Fendt, NP

## 2022-12-09 ENCOUNTER — Encounter (HOSPITAL_BASED_OUTPATIENT_CLINIC_OR_DEPARTMENT_OTHER): Payer: Self-pay | Admitting: Emergency Medicine

## 2022-12-09 ENCOUNTER — Emergency Department (HOSPITAL_BASED_OUTPATIENT_CLINIC_OR_DEPARTMENT_OTHER)
Admission: EM | Admit: 2022-12-09 | Discharge: 2022-12-09 | Disposition: A | Discharge status: Home / Self Care | Payer: Medicaid Other | Attending: Emergency Medicine | Admitting: Emergency Medicine

## 2022-12-09 ENCOUNTER — Other Ambulatory Visit: Payer: Self-pay

## 2022-12-09 DIAGNOSIS — R1030 Lower abdominal pain, unspecified: Secondary | ICD-10-CM | POA: Diagnosis present

## 2022-12-09 DIAGNOSIS — R1032 Left lower quadrant pain: Secondary | ICD-10-CM | POA: Diagnosis not present

## 2022-12-09 DIAGNOSIS — R1031 Right lower quadrant pain: Secondary | ICD-10-CM | POA: Insufficient documentation

## 2022-12-09 HISTORY — DX: Post-traumatic stress disorder, unspecified: F43.10

## 2022-12-09 LAB — URINALYSIS, ROUTINE W REFLEX MICROSCOPIC
Bilirubin Urine: NEGATIVE
Glucose, UA: NEGATIVE mg/dL
Hgb urine dipstick: NEGATIVE
Ketones, ur: 15 mg/dL — AB
Leukocytes,Ua: NEGATIVE
Nitrite: NEGATIVE
Specific Gravity, Urine: 1.038 — ABNORMAL HIGH (ref 1.005–1.030)
pH: 5.5 (ref 5.0–8.0)

## 2022-12-09 LAB — CBC
HCT: 40.1 % (ref 36.0–46.0)
Hemoglobin: 13.1 g/dL (ref 12.0–15.0)
MCH: 28.7 pg (ref 26.0–34.0)
MCHC: 32.7 g/dL (ref 30.0–36.0)
MCV: 87.9 fL (ref 80.0–100.0)
Platelets: 344 10*3/uL (ref 150–400)
RBC: 4.56 MIL/uL (ref 3.87–5.11)
RDW: 13.4 % (ref 11.5–15.5)
WBC: 5.1 10*3/uL (ref 4.0–10.5)
nRBC: 0 % (ref 0.0–0.2)

## 2022-12-09 LAB — COMPREHENSIVE METABOLIC PANEL
ALT: 8 U/L (ref 0–44)
AST: 12 U/L — ABNORMAL LOW (ref 15–41)
Albumin: 4.6 g/dL (ref 3.5–5.0)
Alkaline Phosphatase: 59 U/L (ref 38–126)
Anion gap: 9 (ref 5–15)
BUN: 9 mg/dL (ref 6–20)
CO2: 26 mmol/L (ref 22–32)
Calcium: 10.3 mg/dL (ref 8.9–10.3)
Chloride: 104 mmol/L (ref 98–111)
Creatinine, Ser: 1.02 mg/dL — ABNORMAL HIGH (ref 0.44–1.00)
GFR, Estimated: >60 mL/min (ref >60)
Glucose, Bld: 78 mg/dL (ref 70–99)
Potassium: 4.3 mmol/L (ref 3.5–5.1)
Sodium: 139 mmol/L (ref 135–145)
Total Bilirubin: 0.6 mg/dL (ref 0.3–1.2)
Total Protein: 7.7 g/dL (ref 6.5–8.1)

## 2022-12-09 LAB — PREGNANCY, URINE: Preg Test, Ur: NEGATIVE

## 2022-12-09 LAB — LIPASE, BLOOD: Lipase: 25 U/L (ref 11–51)

## 2022-12-09 MED ORDER — KETOROLAC TROMETHAMINE 15 MG/ML IJ SOLN
15.0000 mg | Freq: Once | INTRAMUSCULAR | Status: AC
  Administered 2022-12-09: 15 mg via INTRAVENOUS
  Filled 2022-12-09: qty 1

## 2022-12-09 MED ORDER — ONDANSETRON HCL 4 MG/2ML IJ SOLN
4.0000 mg | Freq: Once | INTRAMUSCULAR | Status: AC
  Administered 2022-12-09: 4 mg via INTRAVENOUS
  Filled 2022-12-09: qty 2

## 2022-12-09 MED ORDER — SODIUM CHLORIDE 0.9 % IV BOLUS
1000.0000 mL | Freq: Once | INTRAVENOUS | Status: AC
  Administered 2022-12-09: 1000 mL via INTRAVENOUS

## 2022-12-09 NOTE — Discharge Instructions (Signed)
Please follow-up with your primary care doctor, return to the ER if you feel like your symptoms are getting worse.  You can take Tylenol and ibuprofen for your pain control.

## 2022-12-09 NOTE — ED Triage Notes (Signed)
Patient c/o lower abdominal pain for two weeks with vomiting.

## 2022-12-09 NOTE — ED Provider Notes (Signed)
[facility] [facility]ERGENCY DEPARTMENT AT [facility] Provider Note   CSN: [id number] Arrival date & time: 12/09/22  1334     History  Chief Complaint  Patient presents with   Abdominal Pain    Malika Greiff is a 22 y.o. female, no pertinent past medical history, who presents to the ED secondary to bilateral lower abdominal cramping, has been going on for the last 2 weeks.  She states this started when she was on her period, and has been persistent since then.  She denies any vaginal discharge, urinary symptoms.  Describes as cramping, abruptly came on, and has been persistent.  Describes it as an 8 out of 10 in pain, and has taken some Midol with relief.  Notes that she had 1 episode of vomiting about 3 days ago, but otherwise has not had any nausea, vomiting, diarrhea.  Has been having daily bowel movements.  Is sexually monogamous.     Home Medications Prior to Admission medications   Medication Sig Start Date End Date Taking? Authorizing Provider  FLUoxetine (PROZAC) 10 MG capsule Take 1 capsule (10 mg total) by mouth daily. 10/16/22   Shanna Cisco, NP      Allergies    Patient has no known allergies.    Review of Systems   Review of Systems  Gastrointestinal:  Positive for abdominal pain and vomiting. Negative for constipation and diarrhea.    Physical Exam Updated Vital Signs BP (!) 105/93 (BP Location: Right Arm)   Pulse 98   Temp 98.4 F (36.9 C) (Oral)   Resp 16   Ht 5\' 11"  (1.803 m)   Wt 74.8 kg   LMP 11/23/2022 (Exact Date)   SpO2 98%   BMI 23.01 kg/m  Physical Exam Vitals and nursing note reviewed.  Constitutional:      General: She is not in acute distress.    Appearance: She is well-developed.  HENT:     Head: Normocephalic and atraumatic.  Eyes:     Conjunctiva/sclera: Conjunctivae normal.  Cardiovascular:     Rate and Rhythm: Normal rate and regular rhythm.     Heart sounds: No murmur heard. Pulmonary:     Effort: Pulmonary effort is  normal. No respiratory distress.     Breath sounds: Normal breath sounds.  Abdominal:     Palpations: Abdomen is soft.     Tenderness: There is abdominal tenderness in the right lower quadrant and left lower quadrant. There is no guarding or rebound.  Musculoskeletal:        General: No swelling.     Cervical back: Neck supple.  Skin:    General: Skin is warm and dry.     Capillary Refill: Capillary refill takes less than 2 seconds.  Neurological:     Mental Status: She is alert.  Psychiatric:        Mood and Affect: Mood normal.     ED Results / Procedures / Treatments   Labs (all labs ordered are listed, but only abnormal results are displayed) Labs Reviewed  COMPREHENSIVE METABOLIC PANEL - Abnormal; Notable for the following components:      Result Value   Creatinine, Ser 1.02 (*)    AST 12 (*)    All other components within normal limits  URINALYSIS, ROUTINE W REFLEX MICROSCOPIC - Abnormal; Notable for the following components:   Specific Gravity, Urine 1.038 (*)    Ketones, ur 15 (*)    Protein, ur TRACE (*)    All other components  within normal limits  LIPASE, BLOOD  CBC  PREGNANCY, URINE    EKG None  Radiology No results found.  Procedures Procedures    Medications Ordered in ED Medications  ketorolac (TORADOL) 15 MG/ML injection 15 mg (15 mg Intravenous Given 12/09/22 1509)  sodium chloride 0.9 % bolus 1,000 mL (1,000 mLs Intravenous New Bag/Given 12/09/22 1508)  ondansetron (ZOFRAN) injection 4 mg (4 mg Intravenous Given 12/09/22 1508)    ED Course/ Medical Decision Making/ A&P                             Medical Decision Making Patient is a 22 year old female, here for bilateral lower 2 weeks.  She states that is crampy, and started when she was on her period.  States she is taken some Midol with some relief.  Reports 1 episode of vomiting.  We will obtain labs, for further evaluation.  She has no pelvic symptoms or urinary symptoms, so we will hold  on imaging for now.  She is in no acute distress and is eating in the room.  She denies any concern for STDs.  Her abdominal exam, shows mild bilateral lower quadrant abdominal tenderness, but with no guarding or rebound  Amount and/or Complexity of Data Reviewed Labs: ordered.    Details: Labs are reassuring with no leukocytosis or electrolyte abnormalities Discussion of management or test interpretation with external provider(s): Discussed with patient, her labs are reassuring, urinalysis unremarkable, she is well-appearing and eating in the room.  She has a primary care doctor appointment tomorrow, so we will have her follow-up with primary care.  We discussed return precautions she voiced understanding.  Risk Prescription drug management.    Final Clinical Impression(s) / ED Diagnoses Final diagnoses:  Lower abdominal pain    Rx / DC Orders ED Discharge Orders     None         Rosalie Gelpi, [place], PA 12/09/22 1526    Derwood Kaplan, MD 12/16/22 1443

## 2022-12-09 NOTE — ED Notes (Signed)
Dc instructions reviewed with patient. Patient voiced understanding. Dc with belongings.  °

## 2022-12-10 ENCOUNTER — Telehealth: Payer: Self-pay | Admitting: Family

## 2022-12-10 ENCOUNTER — Ambulatory Visit: Payer: Medicaid Other | Admitting: Family

## 2022-12-10 ENCOUNTER — Ambulatory Visit: Payer: Self-pay | Admitting: *Deleted

## 2022-12-10 NOTE — Telephone Encounter (Signed)
  Chief Complaint: lower abdominal pain Symptoms: pain that radiates to back- patient was seen in ED- no diagnosis given Frequency: started with patient menses but continued after Pertinent Negatives: Patient denies , diarrhea, fever, urination pain, vomiting Disposition: [] ED /[] Urgent Care (no appt availability in office) / [x] Appointment(In office/virtual)/ []  [place] [facility]/ [] Home Care/ [] Refused Recommended Disposition /[] [place] [facility]/ []  Follow-up with PCP Additional Notes: Patient needs follow up in the office- she was seen at ED and treated-but still has the pain.

## 2022-12-10 NOTE — Progress Notes (Unsigned)
Erroneous encounter-disregard

## 2022-12-10 NOTE — Telephone Encounter (Signed)
Reason for Disposition . [1] MODERATE pain (e.g., interferes with normal activities) AND [2] pain comes and goes (cramps) AND [3] present > 24 hours  (Exception: Pain with Vomiting or Diarrhea - see that Guideline.)    Patient was evaluated at ED for this- advised f/u within 3 days  Answer Assessment - Initial Assessment Questions 1. LOCATION: "Where does it hurt?"      Lower abdomen - all over 2. RADIATION: "Does the pain shoot anywhere else?" (e.g., chest, back)     back 3. ONSET: "When did the pain begin?" (e.g., minutes, hours or days ago)      Started 11/23/22 with cycle 4. SUDDEN: "Gradual or sudden onset?"     Gradual- lasting after patient's cycle 5. PATTERN "Does the pain come and go, or is it constant?"    - If it comes and goes: "How long does it last?" "Do you have pain now?"     (Note: Comes and goes means the pain is intermittent. It goes away completely between bouts.)    - If constant: "Is it getting better, staying the same, or getting worse?"      (Note: Constant means the pain never goes away completely; most serious pain is constant and gets worse.)      Constant 6. SEVERITY: "How bad is the pain?"  (e.g., Scale 1-10; mild, moderate, or severe)    - MILD (1-3): Doesn't interfere with normal activities, abdomen soft and not tender to touch.     - MODERATE (4-7): Interferes with normal activities or awakens from sleep, abdomen tender to touch.     - SEVERE (8-10): Excruciating pain, doubled over, unable to do any normal activities.       Moderate/severe 7. RECURRENT SYMPTOM: "Have you ever had this type of stomach pain before?" If Yes, ask: "When was the last time?" and "What happened that time?"      Does have pain with cycle usually ends with cycle- this time it did not 8. CAUSE: "What do you think is causing the stomach pain?"     Not sure 9. RELIEVING/AGGRAVATING FACTORS: "What makes it better or worse?" (e.g., antacids, bending or twisting motion, bowel movement)      no 10. OTHER SYMPTOMS: "Do you have any other symptoms?" (e.g., back pain, diarrhea, fever, urination pain, vomiting)       no  Protocols used: Abdominal Pain - Female-A-AH

## 2022-12-11 ENCOUNTER — Encounter: Payer: Medicaid Other | Admitting: Family

## 2022-12-11 DIAGNOSIS — R1030 Lower abdominal pain, unspecified: Secondary | ICD-10-CM

## 2022-12-11 NOTE — Progress Notes (Deleted)
Patient ID: Victoria Weaver, female    DOB: May 08, 2001  MRN: [id number]  CC: Follow-Up  Subjective: Victoria Weaver is a 22 y.o. female who presents for abdominal pain.  Her concerns today include:  12/09/2022 [facility] Emergency Department at [facility] per MD note: ED Course/ Medical Decision Making/ A&P                 Medical Decision Making Patient is a 22 year old female, here for bilateral lower 2 weeks.  She states that is crampy, and started when she was on her period.  States she is taken some Midol with some relief.  Reports 1 episode of vomiting.  We will obtain labs, for further evaluation.  She has no pelvic symptoms or urinary symptoms, so we will hold on imaging for now.  She is in no acute distress and is eating in the room.  She denies any concern for STDs.  Her abdominal exam, shows mild bilateral lower quadrant abdominal tenderness, but with no guarding or rebound   Amount and/or Complexity of Data Reviewed Labs: ordered.    Details: Labs are reassuring with no leukocytosis or electrolyte abnormalities Discussion of management or test interpretation with external provider(s): Discussed with patient, her labs are reassuring, urinalysis unremarkable, she is well-appearing and eating in the room.  She has a primary care doctor appointment tomorrow, so we will have her follow-up with primary care.  We discussed return precautions she voiced understanding.   12/10/2022 per triage RN call note: Chief Complaint: lower abdominal pain Symptoms: pain that radiates to back- patient was seen in ED- no diagnosis given Frequency: started with patient menses but continued after Pertinent Negatives: Patient denies , diarrhea, fever, urination pain, vomiting Disposition: [] ED /[] Urgent Care (no appt availability in office) / [x] Appointment(In office/virtual)/ []  [place] [facility]/ [] Home Care/ [] Refused Recommended Disposition /[] [place] [facility]/ []  Follow-up with  PCP Additional Notes: Patient needs follow up in the office- she was seen at ED and treated-but still has the pain.  Reason for Disposition  [1] MODERATE pain (e.g., interferes with normal activities) AND [2] pain comes and goes (cramps) AND [3] present > 24 hours  (Exception: Pain with Vomiting or Diarrhea - see that Guideline.)    Patient was evaluated at ED for this- advised f/u within 3 days  Answer Assessment - Initial Assessment Questions 1. LOCATION: "Where does it hurt?"      Lower abdomen - all over 2. RADIATION: "Does the pain shoot anywhere else?" (e.g., chest, back)     back 3. ONSET: "When did the pain begin?" (e.g., minutes, hours or days ago)      Started 11/23/22 with cycle 4. SUDDEN: "Gradual or sudden onset?"     Gradual- lasting after patient's cycle 5. PATTERN "Does the pain come and go, or is it constant?"    - If it comes and goes: "How long does it last?" "Do you have pain now?"     (Note: Comes and goes means the pain is intermittent. It goes away completely between bouts.)    - If constant: "Is it getting better, staying the same, or getting worse?"      (Note: Constant means the pain never goes away completely; most serious pain is constant and gets worse.)      Constant 6. SEVERITY: "How bad is the pain?"  (e.g., Scale 1-10; mild, moderate, or severe)    - MILD (1-3): Doesn't interfere with normal activities, abdomen soft and not  tender to touch.     - MODERATE (4-7): Interferes with normal activities or awakens from sleep, abdomen tender to touch.     - SEVERE (8-10): Excruciating pain, doubled over, unable to do any normal activities.       Moderate/severe 7. RECURRENT SYMPTOM: "Have you ever had this type of stomach pain before?" If Yes, ask: "When was the last time?" and "What happened that time?"      Does have pain with cycle usually ends with cycle- this time it did not 8. CAUSE: "What do you think is causing the stomach pain?"     Not sure 9.  RELIEVING/AGGRAVATING FACTORS: "What makes it better or worse?" (e.g., antacids, bending or twisting motion, bowel movement)     no 10. OTHER SYMPTOMS: "Do you have any other symptoms?" (e.g., back pain, diarrhea, fever, urination pain, vomiting)       no  Protocols used: Abdominal Pain - Female-A-AH   Today's visit 12/11/2022:  There are no problems to display for this patient.    Current Outpatient Medications on File Prior to Visit  Medication Sig Dispense Refill   FLUoxetine (PROZAC) 10 MG capsule Take 1 capsule (10 mg total) by mouth daily. 30 capsule 3   No current facility-administered medications on file prior to visit.    No Known Allergies  Social History   Socioeconomic History   Marital status: Single    Spouse name: Not on file   Number of children: Not on file   Years of education: Not on file   Highest education level: 12th grade  Occupational History   Not on file  Tobacco Use   Smoking status: Never    Passive exposure: Never   Smokeless tobacco: Never  Vaping Use   Vaping Use: Every day  Substance and Sexual Activity   Alcohol use: Not Currently    Comment: no use since 10 months ago   Drug use: Not Currently    Types: Cocaine, Marijuana, MDMA (Ecstacy), "Crack" cocaine    Comment: no use since 10 months ago   Sexual activity: Not Currently    Birth control/protection: None  Other Topics Concern   Not on file  Social History Narrative   Not on file   Social Determinants of Health   Financial Resource Strain: Low Risk  (12/06/2022)   Overall Financial Resource Strain (CARDIA)    Difficulty of Paying Living Expenses: Not very hard  Food Insecurity: Food Insecurity Present (12/06/2022)   Hunger Vital Sign    Worried About Running Out of Food in the Last Year: Sometimes true    Ran Out of Food in the Last Year: Never true  Transportation Needs: No Transportation Needs (12/06/2022)   PRAPARE - Administrator, Civil Service (Medical): No     Lack of Transportation (Non-Medical): No  Physical Activity: Sufficiently Active (12/06/2022)   Exercise Vital Sign    Days of Exercise per Week: 4 days    Minutes of Exercise per Session: 150+ min  Stress: Stress Concern Present (12/06/2022)   [employer] of Occupational Health - Occupational Stress Questionnaire    Feeling of Stress : To some extent  Social Connections: Moderately Integrated (12/06/2022)   Social Connection and Isolation Panel [NHANES]    Frequency of Communication with Friends and Family: Three times a week    Frequency of Social Gatherings with Friends and Family: Patient declined    Attends Religious Services: Never    Active Member of [employer]  or Organizations: Yes    Attends Banker Meetings: More than 4 times per year    Marital Status: Living with partner  Intimate Partner Violence: Not on file    No family history on file.  No past surgical history on file.  ROS: Review of Systems Negative except as stated above  PHYSICAL EXAM: LMP 11/23/2022 (Exact Date)   Physical Exam  {female adult master:310786} {female adult master:310785}     Latest Ref Rng & Units 12/09/2022    2:33 PM 07/17/2022    2:24 PM 10/03/2021    1:56 PM  CMP  Glucose 70 - 99 mg/dL 78  69  90   BUN 6 - 20 mg/dL 9  10  13    Creatinine 0.44 - 1.00 mg/dL 4.09  8.11  9.14   Sodium 135 - 145 mmol/L 139  142  140   Potassium 3.5 - 5.1 mmol/L 4.3  4.5  3.9   Chloride 98 - 111 mmol/L 104  106  107   CO2 22 - 32 mmol/L 26  21  25    Calcium 8.9 - 10.3 mg/dL 78.2  9.6  9.6   Total Protein 6.5 - 8.1 g/dL 7.7  7.3    Total Bilirubin 0.3 - 1.2 mg/dL 0.6  0.2    Alkaline Phos 38 - 126 U/L 59  82    AST 15 - 41 U/L 12  13    ALT 0 - 44 U/L 8  9     Lipid Panel     Component Value Date/Time   CHOL 160 07/17/2022 1424   TRIG 67 07/17/2022 1424   HDL 46 07/17/2022 1424   CHOLHDL 3.5 07/17/2022 1424   LDLCALC 101 (H) 07/17/2022 1424    CBC    Component Value  Date/Time   WBC 5.1 12/09/2022 1433   RBC 4.56 12/09/2022 1433   HGB 13.1 12/09/2022 1433   HGB 12.7 07/17/2022 1424   HCT 40.1 12/09/2022 1433   HCT 37.3 07/17/2022 1424   PLT 344 12/09/2022 1433   PLT 334 07/17/2022 1424   MCV 87.9 12/09/2022 1433   MCV 88 07/17/2022 1424   MCH 28.7 12/09/2022 1433   MCHC 32.7 12/09/2022 1433   RDW 13.4 12/09/2022 1433   RDW 13.1 07/17/2022 1424    ASSESSMENT AND PLAN:  There are no diagnoses linked to this encounter.   Patient was given the opportunity to ask questions.  Patient verbalized understanding of the plan and was able to repeat key elements of the plan. Patient was given clear instructions to go to Emergency Department or return to medical center if symptoms don't improve, worsen, or new problems develop.The patient verbalized understanding.   No orders of the defined types were placed in this encounter.    Requested Prescriptions    No prescriptions requested or ordered in this encounter    No follow-ups on file.  Rema Fendt, NP

## 2022-12-14 ENCOUNTER — Ambulatory Visit: Payer: Medicaid Other | Admitting: Family

## 2022-12-14 DIAGNOSIS — R1030 Lower abdominal pain, unspecified: Secondary | ICD-10-CM

## 2022-12-18 ENCOUNTER — Encounter (HOSPITAL_COMMUNITY): Payer: Medicaid Other | Admitting: Psychiatry

## 2022-12-18 ENCOUNTER — Encounter (HOSPITAL_COMMUNITY): Payer: Self-pay

## 2023-01-08 ENCOUNTER — Encounter (HOSPITAL_COMMUNITY): Payer: Medicaid Other | Admitting: Psychiatry

## 2023-01-11 ENCOUNTER — Telehealth (HOSPITAL_COMMUNITY): Payer: Self-pay | Admitting: Psychiatry

## 2023-01-11 NOTE — Telephone Encounter (Signed)
Thank you for this update 

## 2023-02-08 ENCOUNTER — Telehealth (HOSPITAL_COMMUNITY): Payer: Self-pay | Admitting: Psychiatry
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
## 2023-02-08 NOTE — Telephone Encounter (Signed)
PT CONFIRMED APPT FOR 9/4.

## 2023-02-12 ENCOUNTER — Other Ambulatory Visit (HOSPITAL_COMMUNITY): Payer: Self-pay | Admitting: Psychiatry

## 2023-02-12 DIAGNOSIS — F411 Generalized anxiety disorder: Secondary | ICD-10-CM

## 2023-02-12 DIAGNOSIS — F4310 Post-traumatic stress disorder, unspecified: Secondary | ICD-10-CM

## 2023-02-13 ENCOUNTER — Ambulatory Visit (INDEPENDENT_AMBULATORY_CARE_PROVIDER_SITE_OTHER): Payer: Medicaid Other | Admitting: Psychiatry

## 2023-02-13 ENCOUNTER — Encounter (HOSPITAL_COMMUNITY): Payer: Self-pay | Admitting: Psychiatry

## 2023-02-13 ENCOUNTER — Ambulatory Visit: Payer: Self-pay | Admitting: Family

## 2023-02-13 VITALS — BP 114/72 | HR 59 | Resp 15 | Wt 153.0 lb

## 2023-02-13 DIAGNOSIS — F17200 Nicotine dependence, unspecified, uncomplicated: Secondary | ICD-10-CM | POA: Diagnosis not present

## 2023-02-13 DIAGNOSIS — G4726 Circadian rhythm sleep disorder, shift work type: Secondary | ICD-10-CM | POA: Diagnosis not present

## 2023-02-13 DIAGNOSIS — F411 Generalized anxiety disorder: Secondary | ICD-10-CM | POA: Diagnosis not present

## 2023-02-13 DIAGNOSIS — F4310 Post-traumatic stress disorder, unspecified: Secondary | ICD-10-CM | POA: Diagnosis not present

## 2023-02-13 MED ORDER — HYDROXYZINE HCL 25 MG PO TABS: 25.0000 mg | ORAL_TABLET | Freq: Two times a day (BID) | ORAL | 2 refills | Status: DC | PRN

## 2023-02-13 MED ORDER — FLUOXETINE HCL 10 MG PO CAPS
10.0000 mg | ORAL_CAPSULE | Freq: Every day | ORAL | 2 refills | Status: DC
Start: 2023-02-13 — End: 2023-05-15

## 2023-02-14 NOTE — Patient Instructions (Signed)
Please take your prozac 10mg  daily  Please take a pregnancy test and follow-up with PCP for birth control options.

## 2023-02-20 ENCOUNTER — Ambulatory Visit (INDEPENDENT_AMBULATORY_CARE_PROVIDER_SITE_OTHER): Payer: MEDICAID | Admitting: Family

## 2023-02-20 ENCOUNTER — Encounter: Payer: Self-pay | Admitting: Family

## 2023-02-20 VITALS — BP 112/69 | HR 75 | Temp 97.4°F | Ht 71.0 in | Wt 159.2 lb

## 2023-02-20 DIAGNOSIS — Z3201 Encounter for pregnancy test, result positive: Secondary | ICD-10-CM

## 2023-02-20 LAB — POCT URINE PREGNANCY: Preg Test, Ur: POSITIVE — AB

## 2023-02-20 NOTE — Progress Notes (Signed)
Pt states she is trying to get back on birth control.

## 2023-02-20 NOTE — Progress Notes (Addendum)
Patient ID: Victoria Weaver, female    DOB: [birth year]  MRN: [id number]  CC: Birth Control  Subjective: Victoria Weaver is a 22 y.o. female who presents for birth control.   Her concerns today include:  Reports LMP 01/21/2023. States she would like to begin Depo Provera injection.   Patient Active Problem List   Diagnosis Date Noted   Generalized anxiety disorder 02/13/2023   Shift work sleep disorder 02/13/2023   PTSD (post-traumatic stress disorder) 02/13/2023     Current Outpatient Medications on File Prior to Visit  Medication Sig Dispense Refill   albuterol (VENTOLIN HFA) 108 (90 Base) MCG/ACT inhaler Inhale 2 puffs into the lungs every 6 (six) hours as needed for wheezing or shortness of breath.     FLUoxetine (PROZAC) 10 MG capsule Take 1 capsule (10 mg total) by mouth daily. 30 capsule 2   hydrOXYzine (ATARAX) 25 MG tablet Take 1 tablet (25 mg total) by mouth 2 (two) times daily as needed for anxiety (also can be used for sleep). 60 tablet 2   ibuprofen (ADVIL) 800 MG tablet Take 800 mg by mouth every 8 (eight) hours as needed for moderate pain.     No current facility-administered medications on file prior to visit.    No Known Allergies  Social History   Socioeconomic History   Marital status: Single    Spouse name: Not on file   Number of children: Not on file   Years of education: Not on file   Highest education level: 12th grade  Occupational History   Not on file  Tobacco Use   Smoking status: Some Days    Types: E-cigarettes    Passive exposure: Never   Smokeless tobacco: Never  Vaping Use   Vaping status: Every Day  Substance and Sexual Activity   Alcohol use: Not Currently    Comment: no use since 10 months ago   Drug use: Not Currently    Types: Cocaine, Marijuana, MDMA (Ecstacy), "Crack" cocaine    Comment: no use since 10 months ago   Sexual activity: Not Currently    Birth control/protection: None  Other Topics Concern   Not on file  Social  History Narrative   Not on file   Social Determinants of Health   Financial Resource Strain: Low Risk  (12/06/2022)   Overall Financial Resource Strain (CARDIA)    Difficulty of Paying Living Expenses: Not very hard  Food Insecurity: Food Insecurity Present (12/06/2022)   Hunger Vital Sign    Worried About Running Out of Food in the Last Year: Sometimes true    Ran Out of Food in the Last Year: Never true  Transportation Needs: No Transportation Needs (12/06/2022)   PRAPARE - Administrator, Civil Service (Medical): No    Lack of Transportation (Non-Medical): No  Physical Activity: Sufficiently Active (12/06/2022)   Exercise Vital Sign    Days of Exercise per Week: 4 days    Minutes of Exercise per Session: 150+ min  Stress: Stress Concern Present (12/06/2022)   [employer] of Occupational Health - Occupational Stress Questionnaire    Feeling of Stress : To some extent  Social Connections: Moderately Integrated (12/06/2022)   Social Connection and Isolation Panel [NHANES]    Frequency of Communication with Friends and Family: Three times a week    Frequency of Social Gatherings with Friends and Family: Patient declined    Attends Religious Services: Never    Database administrator or Organizations:  Yes    Attends Banker Meetings: More than 4 times per year    Marital Status: Living with partner  Intimate Partner Violence: Not on file    No family history on file.  No past surgical history on file.  ROS: Review of Systems Negative except as stated above  PHYSICAL EXAM: BP 112/69   Pulse 75   Temp (!) 97.4 F (36.3 C) (Oral)   Ht 5\' 11"  (1.803 m)   Wt 159 lb 3.2 oz (72.2 kg)   LMP 01/21/2023 (Exact Date)   SpO2 98%   BMI 22.20 kg/m   Physical Exam HENT:     Head: Normocephalic and atraumatic.     Nose: Nose normal.     Mouth/Throat:     Mouth: Mucous membranes are moist.     Pharynx: Oropharynx is clear.  Eyes:     Extraocular  Movements: Extraocular movements intact.     Conjunctiva/sclera: Conjunctivae normal.     Pupils: Pupils are equal, round, and reactive to light.  Cardiovascular:     Rate and Rhythm: Normal rate and regular rhythm.     Pulses: Normal pulses.     Heart sounds: Normal heart sounds.  Pulmonary:     Effort: Pulmonary effort is normal.     Breath sounds: Normal breath sounds.  Musculoskeletal:        General: Normal range of motion.     Cervical back: Normal range of motion and neck supple.  Neurological:     General: No focal deficit present.     Mental Status: She is alert and oriented to person, place, and time.  Psychiatric:        Mood and Affect: Mood normal.        Behavior: Behavior normal.    Results for orders placed or performed in visit on 02/20/23  POCT urine pregnancy  Result Value Ref Range   Preg Test, Ur Positive (A) Negative    ASSESSMENT AND PLAN: 1. Pregnancy test positive - LMP 01/21/2023. - Positive urine pregnancy test. - Patient provided with pregnancy verification letter. - Referral to Obstetrics / Gynecology for evaluation/management. - POCT urine pregnancy - Ambulatory referral to Obstetrics / Gynecology    Patient was given the opportunity to ask questions.  Patient verbalized understanding of the plan and was able to repeat key elements of the plan. Patient was given clear instructions to go to Emergency Department or return to medical center if symptoms don't improve, worsen, or new problems develop.The patient verbalized understanding.   Orders Placed This Encounter  Procedures   Ambulatory referral to Obstetrics / Gynecology   POCT urine pregnancy    Follow-up with primary provider as scheduled.  Rema Fendt, NP

## 2023-02-21 ENCOUNTER — Telehealth (HOSPITAL_COMMUNITY): Payer: Self-pay | Admitting: Psychiatry

## 2023-02-22 ENCOUNTER — Telehealth (HOSPITAL_COMMUNITY): Payer: Self-pay | Admitting: Psychiatry

## 2023-02-22 NOTE — Telephone Encounter (Signed)
Call with patient from 2:45-3:00pm:   Was informed that patient is now pregnant. Called patient to discuss prozac and hydroxyzine during her pregnancy. Discussed with patient the risks and benefits of continuing to take prozac during her pregnancy including the risk of untreated mental illness as well as the risks of prozac during pregnancy including but not limited to no greater risk of miscarriage, no proven risk for birth defects directly related to fluoxetine (but some studies have suggested an increased chance for heart defects or other birth defects) as well as the risk of pulmonary hypertension at birth (<1%) and that most babies exposed to fluoxetine in late pregnancy don't have withdrawal symptoms but baby could show some symptoms of withdrawal after birth. After the discussion, the patient decided to continue taking her prozac during pregnancy.   Discussed with patient the risks of taking hydroxyzine during pregnancy, that it is a drug class C teratogen and the limited number of studies on hydroxyzine during pregnancy. After the discussion, patient decided to not take hydroxyzine during pregnancy.    Patient expressed appreciation for the call.

## 2023-03-09 ENCOUNTER — Emergency Department (HOSPITAL_COMMUNITY)
Admission: EM | Admit: 2023-03-09 | Discharge: 2023-03-10 | Disposition: A | Discharge status: Home / Self Care | Payer: MEDICAID | Attending: Emergency Medicine | Admitting: Emergency Medicine

## 2023-03-09 ENCOUNTER — Other Ambulatory Visit: Payer: Self-pay

## 2023-03-09 ENCOUNTER — Encounter (HOSPITAL_COMMUNITY): Payer: Self-pay

## 2023-03-09 DIAGNOSIS — O209 Hemorrhage in early pregnancy, unspecified: Secondary | ICD-10-CM | POA: Diagnosis present

## 2023-03-09 DIAGNOSIS — Z3A08 8 weeks gestation of pregnancy: Secondary | ICD-10-CM | POA: Insufficient documentation

## 2023-03-09 DIAGNOSIS — O039 Complete or unspecified spontaneous abortion without complication: Secondary | ICD-10-CM | POA: Diagnosis not present

## 2023-03-09 LAB — CBC
HCT: 34.9 % — ABNORMAL LOW (ref 36.0–46.0)
Hemoglobin: 11.4 g/dL — ABNORMAL LOW (ref 12.0–15.0)
MCH: 29.3 pg (ref 26.0–34.0)
MCHC: 32.7 g/dL (ref 30.0–36.0)
MCV: 89.7 fL (ref 80.0–100.0)
Platelets: 312 10*3/uL (ref 150–400)
RBC: 3.89 MIL/uL (ref 3.87–5.11)
RDW: 12.4 % (ref 11.5–15.5)
WBC: 7.5 10*3/uL (ref 4.0–10.5)
nRBC: 0 % (ref 0.0–0.2)

## 2023-03-09 LAB — HCG, SERUM, QUALITATIVE: Preg, Serum: POSITIVE — AB

## 2023-03-09 NOTE — ED Triage Notes (Signed)
Pt states that she is [redacted] weeks pregnant and has been bleeding since 4 pm. Pt reports having 1 small clot. Pt reports cramping and lower back pain.

## 2023-03-10 ENCOUNTER — Other Ambulatory Visit (HOSPITAL_COMMUNITY): Payer: Self-pay | Admitting: Radiology

## 2023-03-10 ENCOUNTER — Emergency Department (HOSPITAL_COMMUNITY): Payer: MEDICAID

## 2023-03-10 LAB — COMPREHENSIVE METABOLIC PANEL
ALT: 12 U/L (ref 0–44)
AST: 14 U/L — ABNORMAL LOW (ref 15–41)
Albumin: 4 g/dL (ref 3.5–5.0)
Alkaline Phosphatase: 45 U/L (ref 38–126)
Anion gap: 9 (ref 5–15)
BUN: 8 mg/dL (ref 6–20)
CO2: 23 mmol/L (ref 22–32)
Calcium: 9.1 mg/dL (ref 8.9–10.3)
Chloride: 107 mmol/L (ref 98–111)
Creatinine, Ser: 0.78 mg/dL (ref 0.44–1.00)
GFR, Estimated: >60 mL/min (ref >60)
Glucose, Bld: 73 mg/dL (ref 70–99)
Potassium: 3.5 mmol/L (ref 3.5–5.1)
Sodium: 139 mmol/L (ref 135–145)
Total Bilirubin: 0.3 mg/dL (ref 0.3–1.2)
Total Protein: 7.2 g/dL (ref 6.5–8.1)

## 2023-03-10 LAB — WET PREP, GENITAL
Sperm: NONE SEEN
Trich, Wet Prep: NONE SEEN
WBC, Wet Prep HPF POC: <10 (ref <10)
Yeast Wet Prep HPF POC: NONE SEEN

## 2023-03-10 LAB — LIPASE, BLOOD: Lipase: 36 U/L (ref 11–51)

## 2023-03-10 LAB — ABO/RH
ABO/RH(D): O NEG
Antibody Screen: NEGATIVE

## 2023-03-10 LAB — HCG, QUANTITATIVE, PREGNANCY: hCG, Beta Chain, Quant, S: 7031 m[IU]/mL — ABNORMAL HIGH (ref <5)

## 2023-03-10 MED ORDER — IBUPROFEN 600 MG PO TABS: 600.0000 mg | ORAL_TABLET | Freq: Four times a day (QID) | ORAL | 0 refills | Status: AC | PRN

## 2023-03-10 MED ORDER — RHO D IMMUNE GLOBULIN 1500 UNIT/2ML IJ SOSY
300.0000 ug | PREFILLED_SYRINGE | Freq: Once | INTRAMUSCULAR | Status: AC
  Administered 2023-03-10: 300 ug via INTRAMUSCULAR
  Filled 2023-03-10 (×2): qty 2

## 2023-03-10 MED ORDER — MISOPROSTOL 200 MCG PO TABS: ORAL_TABLET | ORAL | 0 refills | Status: DC

## 2023-03-10 MED ORDER — ONDANSETRON 4 MG PO TBDP: 4.0000 mg | ORAL_TABLET | Freq: Three times a day (TID) | ORAL | 0 refills | Status: AC | PRN

## 2023-03-10 NOTE — ED Provider Notes (Signed)
[place] EMERGENCY DEPARTMENT AT [facility] Provider Note  CSN: [id number] Arrival date & time: 03/09/23 2140  Chief Complaint(s) Threatened Miscarriage  HPI Victoria Weaver is a 22 y.o. female G1, P0 at approximately 8 weeks who presents to the emergency department for vaginal bleeding that began around 4 PM this afternoon after she was doing heavy lifting.  She is endorsing pelvic cramping.  No nausea or vomiting.  Patient has not seen OB/GYN yet.  She was referred to [facility] for an upcoming appointment in 1 week.  Denies any prior history of STDs.  The history is provided by the patient.    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 Authorizing Provider  ibuprofen (ADVIL) 600 MG tablet Take 1 tablet (600 mg total) by mouth every 6 (six) hours as needed for up to 7 days for moderate pain or cramping. 03/10/23 03/17/23 Yes Chany Woolworth, Amadeo Garnet, MD  misoprostol (CYTOTEC) 200 MCG tablet Insert 4 tablets ( ) into vaginal vault on day 1. Repeat dose 24 hours later. 03/10/23  Yes Kemp Gomes, Amadeo Garnet, MD  ondansetron (ZOFRAN-ODT) 4 MG disintegrating tablet Take 1 tablet (4 mg total) by mouth every 8 (eight) hours as needed for up to 3 days for nausea or vomiting. 03/10/23 03/13/23 Yes Naomy Esham, Amadeo Garnet, MD  albuterol (VENTOLIN HFA) 108 (90 Base) MCG/ACT inhaler Inhale 2 puffs into the lungs every 6 (six) hours as needed for wheezing or shortness of breath.    [provider]  FLUoxetine (PROZAC) 10 MG capsule Take 1 capsule (10 mg total) by mouth daily. 02/13/23 05/14/23  Karie Fetch, MD  hydrOXYzine (ATARAX) 25  MG tablet Take 1 tablet (25 mg total) by mouth 2 (two) times daily as needed for anxiety (also can be used for sleep). 02/13/23 05/14/23  Karie Fetch, MD                                                                                                                                    Allergies Patient has no known allergies.  Review of Systems Review of Systems As noted in HPI  Physical Exam Vital Signs  I have reviewed the triage vital signs BP 118/64 (BP Location: Right Arm)   Pulse (!) 56   Temp 98.1 F (36.7 C) (Oral)   Resp 18   Ht 5\' 11"  (1.803 m)   Wt 73.9 kg   LMP 01/21/2023 (Exact Date)   SpO2 100%   BMI 22.73 kg/m   Physical Exam Vitals reviewed. Exam conducted with a chaperone present.  Constitutional:      General:  She is not in acute distress.    Appearance: She is well-developed. She is not diaphoretic.  HENT:     Head: Normocephalic and atraumatic.     Right Ear: External ear normal.     Left Ear: External ear normal.     Nose: Nose normal.  Eyes:     General: No scleral icterus.    Conjunctiva/sclera: Conjunctivae normal.  Neck:     Trachea: Phonation normal.  Cardiovascular:     Rate and Rhythm: Normal rate and regular rhythm.  Pulmonary:     Effort: Pulmonary effort is normal. No respiratory distress.     Breath sounds: No stridor.  Abdominal:     General: There is no distension.     Tenderness: There is abdominal tenderness in the right lower quadrant, suprapubic area and left lower quadrant.  Genitourinary:    Labia:        Right: No rash, tenderness or lesion.        Left: No rash, tenderness or lesion.      Vagina: No bleeding.     Cervix: No discharge, friability, erythema or cervical bleeding.     Comments: Small clots in vaginal vault. OS closed Musculoskeletal:        General: Normal range of motion.     Cervical back: Normal range of motion.  Neurological:     Mental Status: She is alert and oriented to person, place, and time.   Psychiatric:        Behavior: Behavior normal.     ED Results and Treatments Labs (all labs ordered are listed, but only abnormal results are displayed) Labs Reviewed  WET PREP, GENITAL - Abnormal; Notable for the following components:      Result Value   Clue Cells Wet Prep HPF POC PRESENT (*)    All other components within normal limits  HCG, SERUM, QUALITATIVE - Abnormal; Notable for the following components:   Preg, Serum POSITIVE (*)    All other components within normal limits  CBC - Abnormal; Notable for the following components:   Hemoglobin 11.4 (*)    HCT 34.9 (*)    All other components within normal limits  COMPREHENSIVE METABOLIC PANEL - Abnormal; Notable for the following components:   AST 14 (*)    All other components within normal limits  HCG, QUANTITATIVE, PREGNANCY - Abnormal; Notable for the following components:   hCG, Beta Chain, Quant, S 7,031 (*)    All other components within normal limits  LIPASE, BLOOD  ABO/RH  GC/CHLAMYDIA PROBE AMP ([place]) NOT AT [facility]                                                                                                                         EKG  EKG Interpretation Date/Time:    Ventricular Rate:    PR Interval:    QRS Duration:    QT Interval:    QTC Calculation:   R Axis:  Text Interpretation:         Radiology US OB Comp < 14 Wks  Result Date: 03/10/2023 CLINICAL DATA:  Pelvic pain and bleeding, initial encounter EXAM: OBSTETRIC <14 WK [place] AND TRANSVAGINAL OB US TECHNIQUE: Both transabdominal and transvaginal ultrasound examinations were performed for complete evaluation of the gestation as well as the maternal uterus, adnexal regions, and pelvic cul-de-sac. Transvaginal technique was performed to assess early pregnancy. COMPARISON:  None Available. FINDINGS: Intrauterine gestational sac: Present Yolk sac:  Present Embryo:  Present Cardiac Activity: Absent CRL:  9.7 mm   7 w   0 d                   [place] EDC: 10/27/2023 Subchorionic hemorrhage:  None visualized. Maternal uterus/adnexae: No acute abnormality noted. IMPRESSION: Findings meet definitive criteria for failed pregnancy. This follows SRU consensus guidelines: Diagnostic Criteria for Nonviable Pregnancy Early in the First Trimester. [place] Mis J Med [phone]. Critical Value/emergent results were called by telephone at the time of interpretation on 03/10/2023 at 2:47 am to Dr. Drema Pry , who verbally acknowledged these results. Electronically Signed   By: Alcide Clever M.D.   On: 03/10/2023 02:48   US OB Transvaginal  Result Date: 03/10/2023 CLINICAL DATA:  Pelvic pain and bleeding, initial encounter EXAM: OBSTETRIC <14 WK [place] AND TRANSVAGINAL OB US TECHNIQUE: Both transabdominal and transvaginal ultrasound examinations were performed for complete evaluation of the gestation as well as the maternal uterus, adnexal regions, and pelvic cul-de-sac. Transvaginal technique was performed to assess early pregnancy. COMPARISON:  None Available. FINDINGS: Intrauterine gestational sac: Present Yolk sac:  Present Embryo:  Present Cardiac Activity: Absent CRL:  9.7 mm   7 w   0 d                  [place] EDC: 10/27/2023 Subchorionic hemorrhage:  None visualized. Maternal uterus/adnexae: No acute abnormality noted. IMPRESSION: Findings meet definitive criteria for failed pregnancy. This follows SRU consensus guidelines: Diagnostic Criteria for Nonviable Pregnancy Early in the First Trimester. [employer] [employer] J Med [phone]. Critical Value/emergent results were called by telephone at the time of interpretation on 03/10/2023 at 2:47 am to Dr. Drema Pry , who verbally acknowledged these results. Electronically Signed   By: Alcide Clever M.D.   On: 03/10/2023 02:48    Medications Ordered in ED Medications  rho (d) immune globulin (RHIG/RHOPHYLAC) injection 300 mcg (has no administration in time range)   Procedures Procedures  (including critical care  time) Medical Decision Making / ED Course   Medical Decision Making Amount and/or Complexity of Data Reviewed Labs: ordered. Radiology: ordered.  Risk Prescription drug management.    Pelvic cramping with vaginal bleeding in first trimester pregnancy.  Will need to assess for abortion vs ectopic pregnancy.  Pelvic exam with close cervical os.  Mild discomfort to palpation in the pelvic region. hCG qualitative confirm pregnancy.  Beta-hCG quant greater than 7000, CBC without leukocytosis.  Mild anemia. CMP without significant electrolyte derangements or renal sufficiency. ABO O-  Ultrasound notable for IUP with no heartbeat at 7 weeks and 0 days.  Findings consistent with failed pregnancy.  I spoke with Dr. Despina Hidden from OB/GYN who agree with providing patient an option for Cytotec Patient has an upcoming appointment with her clinic in 1 week.  This will need to be changed from establishing primary OB to a gynecology appointment  Rhogam given. Patient requested Rx for cytotec     Final Clinical Impression(s) / ED  Diagnoses Final diagnoses:  Miscarriage   The patient appears reasonably screened and/or stabilized for discharge and I doubt any other medical condition or other [facility] requiring further screening, evaluation, or treatment in the ED at this time. I have discussed the findings, Dx and Tx plan with the patient/family who expressed understanding and agree(s) with the plan. Discharge instructions discussed at length. The patient/family was given strict return precautions who verbalized understanding of the instructions. No further questions at time of discharge.  Disposition: Discharge  Condition: Good  ED Discharge Orders          Ordered    misoprostol (CYTOTEC) 200 MCG tablet        03/10/23 0327    ibuprofen (ADVIL) 600 MG tablet  Every 6 hours PRN        03/10/23 0327    ondansetron (ZOFRAN-ODT) 4 MG disintegrating tablet  Every 8 hours PRN        03/10/23 0327               Follow Up: [facility] [address] [place] [address] [phone] Call  to change visit from Obstetrics to Gynecology    This chart was dictated using voice recognition software.  Despite best efforts to proofread,  errors can occur which can change the documentation meaning.    Nira Conn, MD 03/10/23 [phone]

## 2023-03-11 LAB — RH IG WORKUP (INCLUDES ABO/RH)
ABO/RH(D): O NEG
Antibody Screen: NEGATIVE
Gestational Age(Wks): 3
Unit division: 0
Unit tag comment: 7

## 2023-03-11 LAB — GC/CHLAMYDIA PROBE AMP (~~LOC~~) NOT AT ARMC
Chlamydia: NEGATIVE
Comment: NEGATIVE
Comment: NORMAL
Neisseria Gonorrhea: NEGATIVE

## 2023-03-14 ENCOUNTER — Emergency Department (HOSPITAL_COMMUNITY)
Admission: EM | Admit: 2023-03-14 | Discharge: 2023-03-14 | Disposition: A | Discharge status: Home / Self Care | Payer: MEDICAID | Attending: Emergency Medicine | Admitting: Emergency Medicine

## 2023-03-14 ENCOUNTER — Other Ambulatory Visit: Payer: Self-pay

## 2023-03-14 ENCOUNTER — Encounter (HOSPITAL_COMMUNITY): Payer: Self-pay | Admitting: *Deleted

## 2023-03-14 DIAGNOSIS — N939 Abnormal uterine and vaginal bleeding, unspecified: Secondary | ICD-10-CM

## 2023-03-14 DIAGNOSIS — O209 Hemorrhage in early pregnancy, unspecified: Secondary | ICD-10-CM | POA: Diagnosis present

## 2023-03-14 DIAGNOSIS — O039 Complete or unspecified spontaneous abortion without complication: Secondary | ICD-10-CM | POA: Diagnosis not present

## 2023-03-14 LAB — HCG, SERUM, QUALITATIVE: Preg, Serum: POSITIVE — AB

## 2023-03-14 LAB — CBC WITH DIFFERENTIAL/PLATELET
Abs Immature Granulocytes: 0.02 K/uL (ref 0.00–0.07)
Basophils Absolute: 0 K/uL (ref 0.0–0.1)
Basophils Relative: 1 %
Eosinophils Absolute: 0.1 K/uL (ref 0.0–0.5)
Eosinophils Relative: 2 %
HCT: 37 % (ref 36.0–46.0)
Hemoglobin: 12.2 g/dL (ref 12.0–15.0)
Immature Granulocytes: 0 %
Lymphocytes Relative: 41 %
Lymphs Abs: 2.5 K/uL (ref 0.7–4.0)
MCH: 29.5 pg (ref 26.0–34.0)
MCHC: 33 g/dL (ref 30.0–36.0)
MCV: 89.6 fL (ref 80.0–100.0)
Monocytes Absolute: 0.4 K/uL (ref 0.1–1.0)
Monocytes Relative: 6 %
Neutro Abs: 3.1 K/uL (ref 1.7–7.7)
Neutrophils Relative %: 50 %
Platelets: 301 K/uL (ref 150–400)
RBC: 4.13 MIL/uL (ref 3.87–5.11)
RDW: 12.4 % (ref 11.5–15.5)
WBC: 6.1 K/uL (ref 4.0–10.5)
nRBC: 0 % (ref 0.0–0.2)

## 2023-03-14 LAB — HCG, QUANTITATIVE, PREGNANCY: hCG, Beta Chain, Quant, S: 277 m[IU]/mL — ABNORMAL HIGH

## 2023-03-14 NOTE — ED Provider Notes (Signed)
[place] EMERGENCY DEPARTMENT AT [facility] Provider Note   CSN: [id number] Arrival date & time: 03/14/23  1349     History  Chief Complaint  Patient presents with   Vaginal Bleeding    Victoria Weaver is a 22 y.o. female presents for continued bleeding after miscarriage.  Patient states she had a miscarriage on Saturday, went to the ER, was given a shot and told her bleeding should stop after 2 days but continues to have bleeding.  She endorses nausea, cramping, dizziness, vaginal bleeding which she states was heavier than her normal menstrual cycle.  She denies fevers, chills, shortness of breath, chest pain.   Vaginal Bleeding Associated symptoms: nausea        Home Medications Prior to Admission medications   Medication Sig Start Date End Date Taking? Authorizing Provider  albuterol (VENTOLIN HFA) 108 (90 Base) MCG/ACT inhaler Inhale 2 puffs into the lungs every 6 (six) hours as needed for wheezing or shortness of breath.    [provider]  FLUoxetine (PROZAC) 10 MG capsule Take 1 capsule (10 mg total) by mouth daily. 02/13/23 05/14/23  Karie Fetch, MD  hydrOXYzine (ATARAX) 25 MG tablet Take 1 tablet (25 mg total) by mouth 2 (two) times daily as needed for anxiety (also can be used for sleep). 02/13/23 05/14/23  Karie Fetch, MD  ibuprofen (ADVIL) 600 MG tablet Take 1 tablet (600 mg total) by mouth every 6 (six) hours as needed for up to 7 days for moderate pain or cramping. 03/10/23 03/17/23  Cardama, Amadeo Garnet, MD  misoprostol (CYTOTEC) 200 MCG tablet Insert 4 tablets ( ) into vaginal vault on day 1. Repeat dose 24 hours later. 03/10/23   Nira Conn, MD      Allergies    Patient has no known allergies.    Review of Systems   Review of Systems  Gastrointestinal:  Positive for nausea.  Genitourinary:  Positive for vaginal bleeding.       Cramping    Physical Exam Updated Vital Signs BP 111/76   Pulse (!) 53   Temp 98.2 F  (36.8 C) (Oral)   Resp 19   Ht 5\' 11"  (1.803 m)   Wt 79.9 kg   LMP 01/21/2023 (Exact Date)   SpO2 100%   BMI 24.57 kg/m  Physical Exam Vitals and nursing note reviewed.  Constitutional:      General: She is not in acute distress.    Appearance: She is well-developed. She is not toxic-appearing.  HENT:     Head: Normocephalic and atraumatic.     Mouth/Throat:     Mouth: Mucous membranes are moist.  Eyes:     Conjunctiva/sclera: Conjunctivae normal.  Cardiovascular:     Rate and Rhythm: Normal rate and regular rhythm.  Pulmonary:     Effort: Pulmonary effort is normal. No respiratory distress.     Breath sounds: Normal breath sounds.  Abdominal:     Palpations: Abdomen is soft.     Tenderness: There is no abdominal tenderness.  Musculoskeletal:        General: No swelling.     Cervical back: Neck supple.     Right lower leg: No edema.     Left lower leg: No edema.  Skin:    General: Skin is warm and dry.     Capillary Refill: Capillary refill takes less than 2 seconds.     Coloration: Skin is not pale.  Neurological:     General: No focal deficit  present.     Mental Status: She is alert and oriented to person, place, and time.  Psychiatric:        Mood and Affect: Mood normal.     ED Results / Procedures / Treatments   Labs (all labs ordered are listed, but only abnormal results are displayed) Labs Reviewed  HCG, SERUM, QUALITATIVE - Abnormal; Notable for the following components:      Result Value   Preg, Serum POSITIVE (*)    All other components within normal limits  HCG, QUANTITATIVE, PREGNANCY - Abnormal; Notable for the following components:   hCG, Beta Chain, Quant, S 277 (*)    All other components within normal limits  CBC WITH DIFFERENTIAL/PLATELET    EKG None  Radiology No results found.  Procedures Procedures    Medications Ordered in ED Medications - No data to display  ED Course/ Medical Decision Making/ A&P                                  Medical Decision Making Amount and/or Complexity of Data Reviewed Labs: ordered.  This patient presents to the ED with chief complaint(s) of vaginal bleeding after miscarriage with pertinent past medical history of GAD which further complicates the presenting complaint. The complaint involves an extensive differential diagnosis and also carries with it a high risk of complications and morbidity.    The differential diagnosis includes miscarriage, retained products of conception  Additional history obtained: Records reviewed previous progress notes  ED Course and Reassessment: The patients abnormal results were discussed with the patient and any questions asked were answered to the patients satisfaction. Any follow-up care needed was explained to the patient and the patient expressed understanding.  Independent labs interpretation:  The following labs were independently interpreted:  CBC: No leukocytosis or anemia hCG quant: 277  Consultation: - Consulted or discussed management/test interpretation w/ external professional: None  Consideration for admission or further workup: Patient should follow-up with OB/GYN.         Final Clinical Impression(s) / ED Diagnoses Final diagnoses:  Miscarriage  Vaginal bleeding    Rx / DC Orders ED Discharge Orders     None         Dolphus Jenny, PA-C 03/14/23 1946    Glyn Ade, MD 03/19/23 1257

## 2023-03-14 NOTE — Discharge Instructions (Addendum)
Today you were treated for vaginal bleeding.  Please follow-up with your OB/GYN during your scheduled appointment or if you need to be seen sooner call and try to get an earlier appointment or you may go for a walk-in at the [facility].  Thank you for letting us treat you today. After reviewing your labs and imaging, I feel you are safe to go home. Please follow up with your PCP in the next several days and provide them with your records from this visit. Return to the Emergency Room if pain becomes severe or symptoms worsen.

## 2023-03-14 NOTE — ED Triage Notes (Signed)
Pt had a miscarriage on Sat, Was given a shot and told bleeding should stop after 2 days, continues to have bleeding

## 2023-03-14 NOTE — ED Notes (Signed)
Lab to add on HCG Quant

## 2023-03-26 ENCOUNTER — Ambulatory Visit: Payer: MEDICAID | Admitting: Obstetrics and Gynecology

## 2023-04-02 ENCOUNTER — Encounter: Payer: MEDICAID | Admitting: Obstetrics and Gynecology

## 2023-04-09 ENCOUNTER — Ambulatory Visit: Payer: MEDICAID | Admitting: Obstetrics and Gynecology

## 2023-04-17 ENCOUNTER — Encounter (HOSPITAL_COMMUNITY): Payer: MEDICAID | Admitting: Psychiatry
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
## 2023-05-15 ENCOUNTER — Ambulatory Visit (INDEPENDENT_AMBULATORY_CARE_PROVIDER_SITE_OTHER): Payer: Medicaid Other | Admitting: Psychiatry

## 2023-05-15 VITALS — BP 118/83 | HR 79 | Wt 152.2 lb

## 2023-05-15 DIAGNOSIS — F1290 Cannabis use, unspecified, uncomplicated: Secondary | ICD-10-CM

## 2023-05-15 DIAGNOSIS — G4726 Circadian rhythm sleep disorder, shift work type: Secondary | ICD-10-CM | POA: Diagnosis not present

## 2023-05-15 DIAGNOSIS — F4323 Adjustment disorder with mixed anxiety and depressed mood: Secondary | ICD-10-CM | POA: Insufficient documentation

## 2023-05-15 DIAGNOSIS — F17200 Nicotine dependence, unspecified, uncomplicated: Secondary | ICD-10-CM

## 2023-05-15 MED ORDER — HYDROXYZINE HCL 25 MG PO TABS: 25.0000 mg | ORAL_TABLET | Freq: Two times a day (BID) | ORAL | 2 refills | Status: AC | PRN
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
## 2023-06-26 ENCOUNTER — Encounter (HOSPITAL_COMMUNITY): Payer: Medicaid Other | Admitting: Psychiatry

## 2023-06-26 ENCOUNTER — Encounter (HOSPITAL_COMMUNITY): Payer: Self-pay

## 2023-07-19 ENCOUNTER — Encounter: Payer: Medicaid Other | Admitting: Family
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
## 2023-08-14 ENCOUNTER — Encounter (HOSPITAL_COMMUNITY): Payer: MEDICAID | Admitting: Psychiatry

## 2023-10-09 ENCOUNTER — Ambulatory Visit: Payer: MEDICAID | Admitting: Obstetrics and Gynecology

## 2023-10-09 ENCOUNTER — Encounter: Payer: Self-pay | Admitting: Obstetrics and Gynecology

## 2023-10-09 ENCOUNTER — Other Ambulatory Visit (HOSPITAL_COMMUNITY)
Admission: RE | Admit: 2023-10-09 | Discharge: 2023-10-09 | Disposition: A | Discharge status: Home / Self Care | Payer: MEDICAID | Source: Ambulatory Visit | Attending: Obstetrics and Gynecology | Admitting: Obstetrics and Gynecology

## 2023-10-09 VITALS — BP 111/72 | HR 65 | Ht 71.0 in | Wt 163.0 lb

## 2023-10-09 DIAGNOSIS — Z113 Encounter for screening for infections with a predominantly sexual mode of transmission: Secondary | ICD-10-CM | POA: Insufficient documentation

## 2023-10-09 DIAGNOSIS — Z01419 Encounter for gynecological examination (general) (routine) without abnormal findings: Secondary | ICD-10-CM | POA: Insufficient documentation

## 2023-10-09 DIAGNOSIS — Z1339 Encounter for screening examination for other mental health and behavioral disorders: Secondary | ICD-10-CM | POA: Diagnosis not present

## 2023-10-09 NOTE — Progress Notes (Signed)
 23 y.o. New GYN presents for AEX/PAP/STD screening.

## 2023-10-09 NOTE — Progress Notes (Signed)
 GYNECOLOGY ANNUAL PREVENTATIVE CARE ENCOUNTER NOTE  History:     Victoria Weaver is a 23 y.o. G50P0010 female here for a routine annual gynecologic exam.  Current complaints: none.   Denies abnormal vaginal bleeding, discharge, pelvic pain, problems with intercourse or other gynecologic concerns.    Gynecologic History Patient's last menstrual period was 10/07/2023 (exact date). Contraception: none Last JYN:WGNFA pap today  Obstetric History OB History  Gravida Para Term Preterm AB Living  1    1 0  SAB IAB Ectopic Multiple Live Births  1        # Outcome Date GA Lbr Len/2nd Weight Sex Type Anes PTL Lv  1 SAB             Past Medical History:  Diagnosis Date   PTSD (post-traumatic stress disorder)     History reviewed. No pertinent surgical history.  Current Outpatient Medications on File Prior to Visit  Medication Sig Dispense Refill   hydrOXYzine  (ATARAX ) 25 MG tablet Take 25 mg by mouth.     No current facility-administered medications on file prior to visit.    No Known Allergies  Social History:  reports that she has been smoking e-cigarettes and cigarettes. She has never been exposed to tobacco smoke. She uses smokeless tobacco. She reports that she does not currently use alcohol. She reports that she does not currently use drugs after having used the following drugs: Marijuana, MDMA (Ecstacy), and "Crack" cocaine.  Family History  Problem Relation Age of Onset   Cancer Maternal Aunt     The following portions of the patient's history were reviewed and updated as appropriate: allergies, current medications, past family history, past medical history, past social history, past surgical history and problem list.  Review of Systems Pertinent items noted in HPI and remainder of comprehensive ROS otherwise negative.  Physical Exam:  BP 111/72   Pulse 65   Ht 5\' 11"  (1.803 m)   Wt 163 lb (73.9 kg)   LMP 10/07/2023 (Exact Date)   BMI 22.73 kg/m   CONSTITUTIONAL: Well-developed, well-nourished female in no acute distress.  HENT:  Normocephalic, atraumatic, External right and left ear normal. Oropharynx is clear and moist EYES: Conjunctivae and EOM are normal. NECK: Normal range of motion, supple, no masses.  Normal thyroid.  SKIN: Skin is warm and dry. No rash noted. Not diaphoretic. No erythema. No pallor. MUSCULOSKELETAL: Normal range of motion. No tenderness.  No cyanosis, clubbing, or edema.  2+ distal pulses. NEUROLOGIC: Alert and oriented to person, place, and time. Normal reflexes, muscle tone coordination.  PSYCHIATRIC: Normal mood and affect. Normal behavior. Normal judgment and thought content. CARDIOVASCULAR: Normal heart rate noted, regular rhythm RESPIRATORY: Clear to auscultation bilaterally. Effort and breath sounds normal, no problems with respiration noted. BREASTS: deferred ABDOMEN: Soft, no distention noted.  No tenderness, rebound or guarding.  PELVIC: Normal appearing external genitalia and urethral meatus; normal appearing vaginal mucosa and cervix.  No abnormal discharge noted.  Pap smear obtained.  Vaginal swab taken. Normal uterine size, no other palpable masses, no uterine or adnexal tenderness.  Performed in the presence of a chaperone.   Assessment and Plan:    1. Women's annual routine gynecological examination (Primary) Normal annual exam - Cytology - PAP( [facility])  2. Routine screening for STI (sexually transmitted infection) Per pt request - Cervicovaginal ancillary only( [place]) - HIV antibody (with reflex) - Hepatitis C Antibody - Hepatitis B Surface AntiGEN - RPR  Will follow up  results of pap smear and manage accordingly. Routine preventative health maintenance measures emphasized. Please refer to After Visit Summary for other counseling recommendations.    F/u in 1 year or prn  Avie Boeck, MD, FACOG Obstetrician & Gynecologist, [facility] [employer],  [facility]

## 2023-10-10 ENCOUNTER — Encounter: Payer: Self-pay | Admitting: Obstetrics and Gynecology

## 2023-10-10 ENCOUNTER — Other Ambulatory Visit: Payer: Self-pay

## 2023-10-10 LAB — HEPATITIS B SURFACE ANTIGEN: Hepatitis B Surface Ag: NEGATIVE

## 2023-10-10 LAB — CERVICOVAGINAL ANCILLARY ONLY
Bacterial Vaginitis (gardnerella): POSITIVE — AB
Candida Glabrata: NEGATIVE
Candida Vaginitis: POSITIVE — AB
Chlamydia: NEGATIVE
Comment: NEGATIVE
Comment: NEGATIVE
Comment: NEGATIVE
Comment: NEGATIVE
Comment: NEGATIVE
Comment: NORMAL
Neisseria Gonorrhea: NEGATIVE
Trichomonas: NEGATIVE

## 2023-10-10 LAB — HIV ANTIBODY (ROUTINE TESTING W REFLEX): HIV Screen 4th Generation wRfx: NONREACTIVE

## 2023-10-10 LAB — HEPATITIS C ANTIBODY: Hep C Virus Ab: NONREACTIVE

## 2023-10-10 LAB — RPR: RPR Ser Ql: NONREACTIVE

## 2023-10-10 MED ORDER — METRONIDAZOLE 500 MG PO TABS: 500.0000 mg | ORAL_TABLET | Freq: Two times a day (BID) | ORAL | 0 refills | Status: DC

## 2023-10-10 MED ORDER — FLUCONAZOLE 150 MG PO TABS: 150.0000 mg | ORAL_TABLET | Freq: Once | ORAL | 0 refills | Status: AC

## 2023-10-11 LAB — CYTOLOGY - PAP
Comment: NEGATIVE
Diagnosis: NEGATIVE
Diagnosis: REACTIVE
High risk HPV: NEGATIVE
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
## 2023-11-22 ENCOUNTER — Encounter: Payer: MEDICAID | Admitting: Family

## 2023-11-22 NOTE — Progress Notes (Signed)
 Erroneous encounter-disregard

## 2023-12-10 ENCOUNTER — Ambulatory Visit: Payer: MEDICAID | Admitting: Family

## 2023-12-16 ENCOUNTER — Encounter: Payer: Self-pay | Admitting: Family

## 2023-12-16 ENCOUNTER — Ambulatory Visit: Payer: Self-pay | Admitting: Family

## 2023-12-16 ENCOUNTER — Ambulatory Visit (INDEPENDENT_AMBULATORY_CARE_PROVIDER_SITE_OTHER): Payer: MEDICAID | Admitting: Family

## 2023-12-16 VITALS — BP 118/73 | HR 70 | Temp 98.5°F | Resp 16 | Ht 71.0 in | Wt 171.0 lb

## 2023-12-16 DIAGNOSIS — Z30011 Encounter for initial prescription of contraceptive pills: Secondary | ICD-10-CM

## 2023-12-16 LAB — POCT URINE PREGNANCY: Preg Test, Ur: NEGATIVE

## 2023-12-16 MED ORDER — NORGESTIM-ETH ESTRAD TRIPHASIC 0.18/0.215/0.25 MG-35 MCG PO TABS
1.0000 | ORAL_TABLET | Freq: Every day | ORAL | 11 refills | Status: DC
Start: 2023-12-16 — End: 2024-04-28

## 2023-12-16 NOTE — Progress Notes (Signed)
 Patient ID: Victoria Weaver, female    DOB: 07-Jul-2000  MRN: [id number]  CC: Birth Control  Subjective: Victoria Weaver is a 23 y.o. female who presents for birth control. She is accompanied by her best friend.  Her concerns today include:  States she would like to try birth control pill. No further issues/concerns for discussion today.  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 (Patient not taking: Reported on 12/16/2023)     metroNIDAZOLE  (FLAGYL ) 500 MG tablet Take 1 tablet (500 mg total) by mouth 2 (two) times daily. 14 tablet 0   No current facility-administered medications on file prior to visit.    No Known Allergies  Social History   Socioeconomic History   Marital status: Single    Spouse name: Not on file   Number of children: Not on file   Years of education: Not on file   Highest education level: GED or equivalent  Occupational History   Not on file  Tobacco Use   Smoking status: Some Days    Types: E-cigarettes, Cigarettes    Passive exposure: Never   Smokeless tobacco: Current  Vaping Use   Vaping status: Every Day  Substance and Sexual Activity   Alcohol use: Not Currently    Comment: no use since 10 months ago   Drug use: Not Currently    Types: Marijuana, MDMA (Ecstacy), Crack cocaine    Comment: no use since 10 months ago   Sexual activity: Not Currently    Birth control/protection: None  Other Topics Concern   Not on file  Social History Narrative   Not on file   Social Drivers of Health   Financial Resource Strain:  Medium Risk (11/18/2023)   Overall Financial Resource Strain (CARDIA)    Difficulty of Paying Living Expenses: Somewhat hard  Food Insecurity: Food Insecurity Present (11/18/2023)   Hunger Vital Sign    Worried About Running Out of Food in the Last Year: Sometimes true    Ran Out of Food in the Last Year: Never true  Transportation Needs: No Transportation Needs (11/18/2023)   PRAPARE - Administrator, Civil Service (Medical): No    Lack of Transportation (Non-Medical): No  Physical Activity: Sufficiently Active (12/16/2023)   Exercise Vital Sign    Days of Exercise per Week: 7 days    Minutes of Exercise per Session: 150+ min  Stress: No Stress Concern Present (11/18/2023)   [employer] of Occupational Health - Occupational Stress Questionnaire    Feeling of Stress: Only a little  Social Connections: Moderately Isolated (11/18/2023)   Social Connection and Isolation Panel    Frequency of Communication with Friends and Family: More than three times a week    Frequency of Social Gatherings with Friends and Family: More than three times a week    Attends Religious Services: 1 to 4 times per year    Active Member of [employer] or Organizations: No    Attends Banker Meetings: Not on file    Marital Status: Never married  Catering manager  Violence: Not At Risk (12/16/2023)   Humiliation, Afraid, Rape, and Kick questionnaire    Fear of Current or Ex-Partner: No    Emotionally Abused: No    Physically Abused: No    Sexually Abused: No    Family History  Problem Relation Age of Onset   Cancer Maternal Aunt     History reviewed. No pertinent surgical history.  ROS: Review of Systems Negative except as stated above  PHYSICAL EXAM: BP 118/73   Pulse 70   Temp 98.5 F (36.9 C) (Oral)   Resp 16   Ht 5' 11 (1.803 m)   Wt 171 lb (77.6 kg)   LMP 11/26/2023 (Exact Date)   SpO2 99%   BMI 23.85 kg/m   Physical Exam HENT:     Head: Normocephalic and atraumatic.      Nose: Nose normal.     Mouth/Throat:     Mouth: Mucous membranes are moist.     Pharynx: Oropharynx is clear.  Eyes:     Extraocular Movements: Extraocular movements intact.     Conjunctiva/sclera: Conjunctivae normal.     Pupils: Pupils are equal, round, and reactive to light.  Cardiovascular:     Rate and Rhythm: Normal rate and regular rhythm.     Pulses: Normal pulses.     Heart sounds: Normal heart sounds.  Pulmonary:     Effort: Pulmonary effort is normal.     Breath sounds: Normal breath sounds.  Musculoskeletal:        General: Normal range of motion.     Cervical back: Normal range of motion and neck supple.  Neurological:     General: No focal deficit present.     Mental Status: She is alert and oriented to person, place, and time.  Psychiatric:        Mood and Affect: Mood normal.        Behavior: Behavior normal.     ASSESSMENT AND PLAN: 1. Encounter for initial prescription of contraceptive pills (Primary) - Routine screening. Prescription pending result. - POCT urine pregnancy; Future   Patient was given the opportunity to ask questions.  Patient verbalized understanding of the plan and was able to repeat key elements of the plan. Patient was given clear instructions to go to Emergency Department or return to medical center if symptoms don't improve, worsen, or new problems develop.The patient verbalized understanding.   Orders Placed This Encounter  Procedures   POCT urine pregnancy     Follow-up with primary provider as scheduled.  Greig JINNY Drones, NP

## 2023-12-20 ENCOUNTER — Ambulatory Visit: Payer: MEDICAID | Admitting: Family

## 2024-01-06 ENCOUNTER — Telehealth (HOSPITAL_COMMUNITY): Payer: Self-pay

## 2024-01-06 ENCOUNTER — Other Ambulatory Visit: Payer: Self-pay

## 2024-01-06 MED ORDER — METRONIDAZOLE 500 MG PO TABS: 500.0000 mg | ORAL_TABLET | Freq: Two times a day (BID) | ORAL | 0 refills | Status: DC

## 2024-01-06 NOTE — Progress Notes (Signed)
 Pt called reporting BV symptoms, requesting rx. Rx sent per protocol.

## 2024-01-07 NOTE — Telephone Encounter (Signed)
 Hello,   Is there anyway that patient can have a bridge of medication ?   JNL

## 2024-01-21 ENCOUNTER — Ambulatory Visit: Payer: Self-pay

## 2024-01-21 NOTE — Telephone Encounter (Signed)
 FYI Only or Action Required?: FYI only for provider.  Patient was last seen in primary care on 12/16/2023 by Lorren Greig PARAS, NP.  Called Nurse Triage reporting UTI.  Symptoms began several weeks ago.  Interventions attempted: OTC medications: AZO.  Symptoms are: gradually worsening.  Triage Disposition: See HCP Within 4 Hours (Or PCP Triage)  Patient/caregiver understands and will follow disposition?: Yes Reason for Disposition  Side (flank) or lower back pain present  Answer Assessment - Initial Assessment Questions Scheduled for next day OV. Patient is going to attempt to go to UC after work tonight if she gets off work on time and will call if she needs to cancel this appt.  ED precautions reviewed, pt verbalized understanding.   1. SYMPTOM: What's the main symptom you're concerned about? (e.g., frequency, incontinence)     Burning with urination  2. ONSET     2 weeks  4. CAUSE: What do you think is causing the symptoms?     UTI  5. OTHER SYMPTOMS: Do you have any other symptoms? (e.g., blood in urine, fever, flank pain, pain with urination)     Flank pain present  6. PREGNANCY: Is there any chance you are pregnant? When was your last menstrual period?     States is on BCP  Protocols used: Urinary Symptoms-A-AH Copied from CRM [id number]. Topic: Clinical - Red Word Triage >> Jan 21, 2024  4:41 PM Nathanel BROCKS wrote: Red Word that prompted transfer to Nurse Triage:   Possible uti, painful, burning and itching. Has taken Azo but its not helping any.    ----------------------------------------------------------------------- From previous Reason for Contact - Scheduling: Patient/patient representative is calling to schedule an appointment. Refer to attachments for appointment information.

## 2024-01-22 ENCOUNTER — Ambulatory Visit (INDEPENDENT_AMBULATORY_CARE_PROVIDER_SITE_OTHER): Payer: Self-pay | Admitting: Primary Care

## 2024-01-22 NOTE — Telephone Encounter (Signed)
 Appt was canceled for today
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
## 2024-02-11 ENCOUNTER — Telehealth (INDEPENDENT_AMBULATORY_CARE_PROVIDER_SITE_OTHER): Payer: MEDICAID | Admitting: Psychiatry

## 2024-02-11 ENCOUNTER — Encounter (HOSPITAL_COMMUNITY): Payer: MEDICAID | Admitting: Psychiatry

## 2024-02-11 DIAGNOSIS — F321 Major depressive disorder, single episode, moderate: Secondary | ICD-10-CM | POA: Diagnosis not present

## 2024-02-11 DIAGNOSIS — F17200 Nicotine dependence, unspecified, uncomplicated: Secondary | ICD-10-CM

## 2024-02-11 DIAGNOSIS — F4310 Post-traumatic stress disorder, unspecified: Secondary | ICD-10-CM | POA: Diagnosis not present

## 2024-02-11 DIAGNOSIS — F411 Generalized anxiety disorder: Secondary | ICD-10-CM

## 2024-02-11 DIAGNOSIS — F1211 Cannabis abuse, in remission: Secondary | ICD-10-CM

## 2024-02-11 MED ORDER — FLUOXETINE HCL 10 MG PO CAPS
10.0000 mg | ORAL_CAPSULE | Freq: Every day | ORAL | 2 refills | Status: AC
Start: 2024-02-11 — End: 2024-05-11

## 2024-02-11 MED ORDER — HYDROXYZINE HCL 25 MG PO TABS: 25.0000 mg | ORAL_TABLET | Freq: Every day | ORAL | 2 refills | Status: AC | PRN
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
## 2024-02-12 NOTE — Addendum Note (Signed)
 Addended by: CARVIN CROCK on: 02/12/2024 01:01 PM   Modules accepted: Level of Service
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
## 2024-03-17 ENCOUNTER — Encounter (HOSPITAL_COMMUNITY): Payer: MEDICAID | Admitting: Psychiatry

## 2024-03-17 ENCOUNTER — Telehealth (HOSPITAL_COMMUNITY): Payer: Self-pay | Admitting: Psychiatry

## 2024-03-17 NOTE — Telephone Encounter (Signed)
 Called patient regarding scheduled appointment today at [phone] at 11:05 regarding appointment at 11am. Patient forgot about appointment. Did not want to do virtual, rescheduled in-person appointment to 10/28 at 11am.   Per chart, patient has enough refills of medications until next appointment.

## 2024-03-30 ENCOUNTER — Ambulatory Visit (INDEPENDENT_AMBULATORY_CARE_PROVIDER_SITE_OTHER): Payer: MEDICAID

## 2024-03-30 VITALS — BP 117/73 | HR 105 | Temp 97.9°F | Resp 16 | Ht 71.0 in | Wt 169.0 lb

## 2024-03-30 DIAGNOSIS — J351 Hypertrophy of tonsils: Secondary | ICD-10-CM

## 2024-03-30 NOTE — Progress Notes (Signed)
      Patient ID: Victoria Weaver, female    DOB: 30-Jan-2001  MRN: [id number]  CC: Medical Management of Chronic Issues (Patient said that she has been having tonsil discomfort for a few months, denies any pain)   Subjective: Victoria Weaver is a 23 y.o. female  who presents to clinic for evaluation of swollen tonsils. Pt reports over 3 month history of swollen tonsils with recurrent tonsil stones. Reports history of frequent strep throat infections. Currently not experiencing other symptoms such as cough, sore throat or runny nose. Reports she needs to do better with oral hygiene.   No Known Allergies  ROS: Review of Systems Negative except as stated above  PHYSICAL EXAM: BP 117/73   Pulse (!) 105   Temp 97.9 F (36.6 C) (Oral)   Resp 16   Ht 5' 11 (1.803 m)   Wt 169 lb (76.7 kg)   SpO2 99%   BMI 23.57 kg/m   Physical Exam  General: well-appearing, no acute distress Skin: no jaundice, rashes, or lesions Oropharynx: erythema of oropharynx, edematous palatine tonsils bilaterally, no exudates.  Musculoskeletal: normal gait Extremities: no peripheral edema  ASSESSMENT AND PLAN:  1. Bilateral swelling of tonsils (Primary) - Given recurrent episodes of swollen tonsils and tonsil stones referral placed to ENT for further evaluation and management.  - Ambulatory referral to ENT    Patient was given the opportunity to ask questions.  Patient verbalized understanding of the plan and was able to repeat key elements of the plan.    Orders Placed This Encounter  Procedures   Ambulatory referral to ENT     Requested Prescriptions    No prescriptions requested or ordered in this encounter    No follow-ups on file.  Sula Leavy Rode, PA-C

## 2024-03-31 ENCOUNTER — Encounter (INDEPENDENT_AMBULATORY_CARE_PROVIDER_SITE_OTHER): Payer: Self-pay
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
## 2024-04-07 ENCOUNTER — Encounter (HOSPITAL_COMMUNITY): Payer: Self-pay

## 2024-04-07 ENCOUNTER — Encounter (HOSPITAL_COMMUNITY): Payer: MEDICAID | Admitting: Psychiatry

## 2024-04-28 ENCOUNTER — Ambulatory Visit
Admission: EM | Admit: 2024-04-28 | Discharge: 2024-04-28 | Disposition: A | Discharge status: Home / Self Care | Payer: MEDICAID | Attending: Emergency Medicine | Admitting: Emergency Medicine

## 2024-04-28 DIAGNOSIS — O219 Vomiting of pregnancy, unspecified: Secondary | ICD-10-CM

## 2024-04-28 LAB — POCT URINE DIPSTICK
Bilirubin, UA: NEGATIVE
Blood, UA: NEGATIVE
Glucose, UA: NEGATIVE mg/dL
Nitrite, UA: NEGATIVE
POC PROTEIN,UA: 100 — AB
Spec Grav, UA: 1.015 (ref 1.010–1.025)
Urobilinogen, UA: 1 U/dL
pH, UA: >=9 — AB (ref 5.0–8.0)

## 2024-04-28 LAB — POCT URINE PREGNANCY: Preg Test, Ur: POSITIVE — AB

## 2024-04-28 MED ORDER — ONDANSETRON HCL 4 MG/2ML IJ SOLN
4.0000 mg | Freq: Once | INTRAMUSCULAR | Status: AC
  Administered 2024-04-28: 4 mg via INTRAMUSCULAR

## 2024-04-28 MED ORDER — SODIUM CHLORIDE 0.9 % IV BOLUS
500.0000 mL | Freq: Once | INTRAVENOUS | Status: AC
  Administered 2024-04-28: 500 mL via INTRAVENOUS

## 2024-04-28 MED ORDER — DOXYLAMINE-PYRIDOXINE 10-10 MG PO TBEC: DELAYED_RELEASE_TABLET | ORAL | 2 refills | Status: DC

## 2024-04-28 NOTE — ED Triage Notes (Addendum)
 Since Wednesday she has been throwing up. She states she feels like she has been throwing up constantly since then. She states at least 4 times a day. She states she has been having hot flashes and sweats as well.   She states she did try smoking weed to see if this would help.

## 2024-04-28 NOTE — Discharge Instructions (Addendum)
 Nausea and vomiting during pregnancy, while very common, is miserable and I am very sorry that you are having to deal with this.  Please, pretty please, do not smoke marijuana during your pregnancy.  Smoking marijuana puts your child at serious risk of spontaneous preterm birth and birth weight being much smaller than normal.  Smoking marijuana also increases your risk of gestational hypertension as well as a deadly complication of pregnancy called preeclampsia.  Smoking marijuana can also cause the placenta, the organ that your uterus creates to nourish your baby as your baby grows, to rupture which will result in miscarriage and threaten your life as well due to excessive blood loss.  To quickly relieve your nausea, you were provided with a dose of Zofran  and 500 cc of normal saline to help rehydrate you.  I sincerely hope this has helped you feel better.  For continued management of nausea and vomiting during your pregnancy, the following is recommended:  Avoid triggers that can cause nausea and vomiting.  Examples of some triggers include but not limited to stuffy rooms, odors, heat, humidity, noise, visual or physical motion, and gastric irritants (eg, coffee, iron supplements).  Everyone's triggers are different so pay careful attention to smells, tastes and sensations that trigger you.  Eat small amounts of food every one to two hours to avoid an empty or full stomach. It can be helpful to eliminate spicy, odorous, high-fat, acidic, and very sweet foods, and substitute protein-dominant, salty, low-fat, bland, and/or dry foods. Fluids should be consumed at least 30 minutes before or after solid food to minimize the effect of a full stomach. Fluids are better tolerated if cold, clear, and carbonated or sour. Avoid lying down after eating.   I provided you with a prescription of the medication called Diclegis (doxylamine-pyridoxine).     Diclegis is also in the same drug class as hydroxyzine  so you  will need to discontinue hydroxyzine  while taking Diclegis.  Diclegis will work just as well to relieve anxiety, it has the same calming effect.  Diclegis medication can make also make you sleepy, just as hydroxyzine  does.   This medication is started at a low dose and gradually increased until you have relief of your nausea.  Please follow these instructions:  Please take 1 tablet tonight at bedtime and 1 tablet tomorrow night at bedtime.    By day 3, if you continue to have nausea with or without vomiting, please take 1 tablet in the morning and 2 tablets at bedtime.  You can continue this dose if you have relief of symptoms.  If you do not have relief of symptoms with 1 tablet in the morning and 1 tablet at bedtime, you can increase your dose to 1 tablet in the morning, 1 tablet in the middle of the day and 2 tablets at bedtime.    Please do not take more than 4 tablets/day.  If the above recommendations are not providing you with sufficient relief, please know there are other medications to try.  These can be prescribed by your obstetrician.    The results of your blood test should be available within the next day or 2, initially they will be posted to your MyChart account.  If there are any abnormal findings, you will receive a phone call with further instructions as needed.  Please reach out to your obstetrician to let them know that you are having trouble with nausea and vomiting so they can be aware.  If your obstetrician  has access to [facility], [facility] or [facility] electronic medical records, then they will be able to read my note for your visit today.  If your symptoms become worse this evening, please go to the emergency room for further evaluation and treatment.  They may wish to keep you overnight for observation.  Thank you for visiting [facility] today.  We appreciate the opportunity to participate in your care.

## 2024-04-28 NOTE — ED Provider Notes (Signed)
 Victoria Weaver    CSN: [id number] Arrival date & time: 04/28/24  1715    HISTORY  No chief complaint on file.  HPI Victoria Weaver is a pleasant, 23 y.o. female who presents to urgent care today. 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 OB History     Gravida  1   Para      Term      Preterm      AB  1   Living  0      SAB  1   IAB      Ectopic      Multiple      Live Births             Home Medications    Prior to Admission medications   Medication Sig Start Date End Date Taking? Authorizing Provider  FLUoxetine  (PROZAC ) 10 MG capsule Take 1 capsule (10 mg total) by mouth daily. 02/11/24 05/11/24  Chien, Stephanie, MD  hydrOXYzine  (ATARAX ) 25 MG tablet Take 1 tablet (25 mg total) by mouth daily as needed for anxiety. 02/11/24 05/11/24  Chien, Stephanie, MD  Norgestimate-Ethinyl Estradiol Triphasic 0.18/0.215/0.25 MG-35 MCG tablet Take 1 tablet by mouth daily. 12/16/23   Jaycee Greig PARAS, NP    Family History Family History  Problem Relation Age of Onset   Cancer Maternal Aunt    Social History Social History   Tobacco Use   Smoking status: Some Days    Types: E-cigarettes, Cigarettes    Passive exposure: Never   Smokeless tobacco: Current  Vaping Use   Vaping status: Every Day  Substance Use Topics   Alcohol use: Not Currently    Comment: no use since 10 months ago   Drug use: Not Currently    Types: Marijuana, MDMA (Ecstacy), Crack cocaine    Comment: no use since 10 months ago   Allergies   Patient has no known allergies.  Review  of Systems Review of Systems Pertinent findings revealed after performing a 14 point review of systems has been noted in the history of present illness.  Physical Exam Vital Signs There were no vitals taken for this visit.  No data found.  Physical Exam  Visual Acuity Right Eye Distance:   Left Eye Distance:   Bilateral Distance:    Right Eye Near:   Left Eye Near:    Bilateral Near:     Weaver Couse / Diagnostics / Procedures:     Radiology No results found.  Procedures Procedures (including critical care time) EKG  Pending results:  Labs Reviewed - No data to display  Medications Ordered in Weaver: Medications  ondansetron  (ZOFRAN ) injection 4 mg (has no administration in time range)    Weaver Diagnoses / Final Clinical Impressions(s)   I have reviewed the triage vital signs and the nursing notes.  Pertinent labs & imaging results that were available during my care of the patient were reviewed by me and considered in my medical decision making (see chart for details).    Final diagnoses:  None   ***  Please see discharge instructions below  for details of plan of care as provided to patient. ED Prescriptions   None    PDMP not reviewed this encounter.  Discharge Instructions   None     Disposition Upon Discharge:  Condition: stable for discharge home  Patient presented with an acute illness with associated systemic symptoms and significant discomfort requiring urgent management. In my opinion, this is a condition that a prudent lay person (someone who possesses an average knowledge of health and medicine) may potentially expect to result in complications if not addressed urgently such as respiratory distress, impairment of bodily function or dysfunction of bodily organs.   Routine symptom specific, illness specific and/or disease specific instructions were discussed with the patient and/or caregiver at length.   As such, the patient has been evaluated and  assessed, work-up was performed and treatment was provided in alignment with urgent care protocols and evidence based medicine.  Patient/parent/caregiver has been advised that the patient may require follow up for further testing and treatment if the symptoms continue in spite of treatment, as clinically indicated and appropriate.  Patient/parent/caregiver has been advised to return to the [facility] or PCP if no better; to PCP or the Emergency Department if new signs and symptoms develop, or if the current signs or symptoms continue to change or worsen for further workup, evaluation and treatment as clinically indicated and appropriate  The patient will follow up with their current PCP if and as advised. If the patient does not currently have a PCP we will assist them in obtaining one.   The patient may need specialty follow up if the symptoms continue, in spite of conservative treatment and management, for further workup, evaluation, consultation and treatment as clinically indicated and appropriate.  Patient/parent/caregiver verbalized understanding and agreement of plan as discussed.  All questions were addressed during visit.  Please see discharge instructions below for further details of plan.  This office note has been dictated using Teaching laboratory technician.  Unfortunately, this method of dictation can sometimes lead to typographical or grammatical errors.  I apologize for your inconvenience in advance if this occurs.  Please do not hesitate to reach out to me if clarification is needed.

## 2024-04-29 ENCOUNTER — Ambulatory Visit (INDEPENDENT_AMBULATORY_CARE_PROVIDER_SITE_OTHER): Payer: MEDICAID | Admitting: Otolaryngology

## 2024-04-29 ENCOUNTER — Encounter (INDEPENDENT_AMBULATORY_CARE_PROVIDER_SITE_OTHER): Payer: Self-pay | Admitting: Otolaryngology

## 2024-04-29 ENCOUNTER — Ambulatory Visit (HOSPITAL_COMMUNITY): Payer: Self-pay

## 2024-04-29 VITALS — BP 128/82 | HR 67 | Temp 97.7°F | Ht 71.0 in | Wt 173.0 lb

## 2024-04-29 DIAGNOSIS — J3501 Chronic tonsillitis: Secondary | ICD-10-CM

## 2024-04-29 LAB — CBC WITH DIFFERENTIAL/PLATELET
Basophils Absolute: 0.1 x10E3/uL (ref 0.0–0.2)
Basos: 1 %
EOS (ABSOLUTE): 0.1 x10E3/uL (ref 0.0–0.4)
Eos: 1 %
Hematocrit: 36.6 % (ref 34.0–46.6)
Hemoglobin: 12.3 g/dL (ref 11.1–15.9)
Immature Grans (Abs): 0 x10E3/uL (ref 0.0–0.1)
Immature Granulocytes: 0 %
Lymphocytes Absolute: 2.2 x10E3/uL (ref 0.7–3.1)
Lymphs: 24 %
MCH: 29.6 pg (ref 26.6–33.0)
MCHC: 33.6 g/dL (ref 31.5–35.7)
MCV: 88 fL (ref 79–97)
Monocytes Absolute: 0.6 x10E3/uL (ref 0.1–0.9)
Monocytes: 6 %
Neutrophils Absolute: 6.1 x10E3/uL (ref 1.4–7.0)
Neutrophils: 68 %
Platelets: 400 x10E3/uL (ref 150–450)
RBC: 4.15 x10E6/uL (ref 3.77–5.28)
RDW: 14.3 % (ref 11.7–15.4)
WBC: 9 x10E3/uL (ref 3.4–10.8)

## 2024-04-29 LAB — COMPREHENSIVE METABOLIC PANEL WITH GFR
ALT: 10 IU/L (ref 0–32)
AST: 12 IU/L (ref 0–40)
Albumin: 4.6 g/dL (ref 4.0–5.0)
Alkaline Phosphatase: 57 IU/L (ref 41–116)
BUN/Creatinine Ratio: 9 (ref 9–23)
BUN: 7 mg/dL (ref 6–20)
Bilirubin Total: 0.4 mg/dL (ref 0.0–1.2)
CO2: 18 mmol/L — ABNORMAL LOW (ref 20–29)
Calcium: 10.2 mg/dL (ref 8.7–10.2)
Chloride: 108 mmol/L — ABNORMAL HIGH (ref 96–106)
Creatinine, Ser: 0.82 mg/dL (ref 0.57–1.00)
Globulin, Total: 2.4 g/dL (ref 1.5–4.5)
Glucose: 94 mg/dL (ref 70–99)
Potassium: 4.4 mmol/L (ref 3.5–5.2)
Sodium: 141 mmol/L (ref 134–144)
Total Protein: 7 g/dL (ref 6.0–8.5)
eGFR: 104 mL/min/1.73 (ref >59)

## 2024-04-29 LAB — LIPASE: Lipase: 25 U/L (ref 14–72)

## 2024-04-29 LAB — BETA HCG QUANT (REF LAB): hCG Quant: 14856 m[IU]/mL

## 2024-04-29 LAB — MAGNESIUM: Magnesium: 2.2 mg/dL (ref 1.6–2.3)

## 2024-04-29 MED ORDER — DOXYLAMINE-PYRIDOXINE 10-10 MG PO TBEC: DELAYED_RELEASE_TABLET | ORAL | 2 refills | Status: AC

## 2024-04-29 NOTE — Telephone Encounter (Signed)
 Patient called and pharmacy was out of the medication sent in on 04/28/2024. RX sent to new pharmacy.

## 2024-04-29 NOTE — Progress Notes (Signed)
 Reason for Consult:tonsillitis Referring Physician: Dr Victoria Weaver is an 23 y.o. female.  HPI: hx of tonsil infection for years. She has 2-3 per year for many years. She alos has tonsil stones constantly. They have not improved with antibiotics. No snoring. No airway issues. She reports she currently is pregnant  Past Medical History:  Diagnosis Date   PTSD (post-traumatic stress disorder)     History reviewed. No pertinent surgical history.  Family History  Problem Relation Age of Onset   Cancer Maternal Aunt     Social History:  reports that she has been smoking e-cigarettes and cigarettes. She has never been exposed to tobacco smoke. She uses smokeless tobacco. She reports that she does not currently use alcohol. She reports that she does not currently use drugs after having used the following drugs: Marijuana, MDMA (Ecstacy), and Crack cocaine.  Allergies: No Known Allergies  Medications: I have reviewed the patient's current medications.  Results for orders placed or performed during the hospital encounter of 04/28/24 (from the past 48 hours)  POCT urine pregnancy     Status: Abnormal   Collection Time: 04/28/24  6:20 PM  Result Value Ref Range   Preg Test, Ur Positive (A) Negative  POCT URINE DIPSTICK     Status: Abnormal   Collection Time: 04/28/24  6:20 PM  Result Value Ref Range   Color, UA yellow yellow   Clarity, UA cloudy (A) clear   Glucose, UA negative negative mg/dL   Bilirubin, UA negative negative   Ketones, POC UA moderate (40) (A) negative mg/dL   Spec Grav, UA 8.984 8.989 - 1.025   Blood, UA negative negative   pH, UA >=9.0 (A) 5.0 - 8.0   POC PROTEIN,UA =100 (A) negative, trace   Urobilinogen, UA 1.0 0.2 or 1.0 E.U./dL   Nitrite, UA Negative Negative   Leukocytes, UA Trace (A) Negative  Comprehensive metabolic panel     Status: Abnormal   Collection Time: 04/28/24  7:21 PM  Result Value Ref Range   Glucose 94 70 - 99 mg/dL   BUN 7 6 -  20 mg/dL   Creatinine, Ser 9.17 0.57 - 1.00 mg/dL   eGFR 895 >40 fO/fpw/8.26   BUN/Creatinine Ratio 9 9 - 23   Sodium 141 134 - 144 mmol/L   Potassium 4.4 3.5 - 5.2 mmol/L   Chloride 108 (H) 96 - 106 mmol/L   CO2 18 (L) 20 - 29 mmol/L   Calcium 10.2 8.7 - 10.2 mg/dL   Total Protein 7.0 6.0 - 8.5 g/dL   Albumin 4.6 4.0 - 5.0 g/dL   Globulin, Total 2.4 1.5 - 4.5 g/dL   Bilirubin Total 0.4 0.0 - 1.2 mg/dL   Alkaline Phosphatase 57 41 - 116 IU/L   AST 12 0 - 40 IU/L   ALT 10 0 - 32 IU/L  CBC with Differential/Platelet     Status: None   Collection Time: 04/28/24  7:21 PM  Result Value Ref Range   WBC 9.0 3.4 - 10.8 x10E3/uL   RBC 4.15 3.77 - 5.28 x10E6/uL   Hemoglobin 12.3 11.1 - 15.9 g/dL   Hematocrit 63.3 65.9 - 46.6 %   MCV 88 79 - 97 fL   MCH 29.6 26.6 - 33.0 pg   MCHC 33.6 31.5 - 35.7 g/dL   RDW 85.6 88.2 - 84.5 %   Platelets 400 150 - 450 x10E3/uL   Neutrophils 68 Not Estab. %   Lymphs 24 Not Estab. %  Monocytes 6 Not Estab. %   Eos 1 Not Estab. %   Basos 1 Not Estab. %   Neutrophils Absolute 6.1 1.4 - 7.0 x10E3/uL   Lymphocytes Absolute 2.2 0.7 - 3.1 x10E3/uL   Monocytes Absolute 0.6 0.1 - 0.9 x10E3/uL   EOS (ABSOLUTE) 0.1 0.0 - 0.4 x10E3/uL   Basophils Absolute 0.1 0.0 - 0.2 x10E3/uL   Immature Granulocytes 0 Not Estab. %   Immature Grans (Abs) 0.0 0.0 - 0.1 x10E3/uL  Beta hCG quant (ref lab)     Status: None   Collection Time: 04/28/24  7:22 PM  Result Value Ref Range   hCG Quant 14,856 mIU/mL    Comment:                      Female (Non-pregnant)    0 -     5                             (Postmenopausal)  0 -     8                      Female (Pregnant)                      Weeks of Gestation                              3                6 -    71                              4               10 -   750                              5              217 -  7138                              6              158 - 68204                              7             [id number]  -[id number]                              8            [id number] -[id number]                              9            [id number]                             89            [id number] -[id number]  12            27832 -[id number]                             14            13950 - 62530                             15            12039 - 70971                             16             9040 - 56451                             17             8175 - 55868                             18             [phone] Results  confirmed on dilution. Roche ECLIA methodology   Lipase     Status: None   Collection Time: 04/28/24  7:22 PM  Result Value Ref Range   Lipase 25 14 - 72 U/L  Magnesium     Status: None   Collection Time: 04/28/24  7:22 PM  Result Value Ref Range   Magnesium 2.2 1.6 - 2.3 mg/dL    No results found.  ROS Blood pressure 128/82, pulse 67, temperature 97.7 F (36.5 C), height 5' 11 (1.803 m), weight 173 lb (78.5 kg), last menstrual period 02/23/2024, SpO2 98%. Physical Exam Constitutional:      Appearance: Normal appearance.  HENT:     Head: Normocephalic and atraumatic.     Right Ear: Tympanic membrane is without lesions and middle ear aerated, ear canal and external ear normal.     Left Ear: Tympanic membrane is without lesions and middle ear aerated, ear canal and external ear normal.     Nose: Nose normal. Turbinates with mild hypertrophy, No significant swelling or masses.     Oral cavity/oropharynx: tonsils +3 without exudate  Mucous membranes are moist. No lesions or masses    Larynx: normal voice. Mirror attempted without success    Eyes:     Extraocular Movements: Extraocular movements intact.     Conjunctiva/sclera: Conjunctivae normal.     Pupils: Pupils are equal, round, and reactive to light.  Cardiovascular:     Rate and Rhythm: Normal rate.  Pulmonary:     Effort: Pulmonary effort is normal.  Musculoskeletal:     Cervical back: Normal range of  motion and neck supple. No rigidity.  Lymphadenopathy:     Cervical: No cervical adenopathy or masses.salivary glands without lesions. .  Neurological:     Mental Status: He is alert. CN 2-12 intact. No nystagmus      Assessment/Plan: Chronic tonsillitis- she is pregnant so no elective procedure now. She meets the criteria for tonsillectomy. Will discuss further once she delivers.   Victoria Weaver 04/29/2024, 11:36 AM
